# Patient Record
Sex: Female | Born: 1970
Health system: Southern US, Community
[De-identification: ages and names within clinical notes are randomized; demographics above are authoritative.]

## PROBLEM LIST (undated history)

## (undated) DIAGNOSIS — N943 Premenstrual tension syndrome: Secondary | ICD-10-CM

## (undated) DIAGNOSIS — M1990 Unspecified osteoarthritis, unspecified site: Secondary | ICD-10-CM

## (undated) DIAGNOSIS — T7840XA Allergy, unspecified, initial encounter: Secondary | ICD-10-CM

## (undated) DIAGNOSIS — J302 Other seasonal allergic rhinitis: Secondary | ICD-10-CM

## (undated) DIAGNOSIS — K219 Gastro-esophageal reflux disease without esophagitis: Secondary | ICD-10-CM

## (undated) HISTORY — DX: Other seasonal allergic rhinitis: J30.2

## (undated) HISTORY — DX: Gastro-esophageal reflux disease without esophagitis: K21.9

## (undated) HISTORY — DX: Allergy, unspecified, initial encounter: T78.40XA

## (undated) HISTORY — DX: Unspecified osteoarthritis, unspecified site: M19.90

## (undated) HISTORY — PX: WISDOM TOOTH EXTRACTION: SHX21

## (undated) HISTORY — DX: Premenstrual tension syndrome: N94.3

## (undated) HISTORY — PX: ABDOMINAL HYSTERECTOMY: SHX81

## (undated) HISTORY — PX: LEFT OOPHORECTOMY: SHX1961

---

## 2000-09-17 ENCOUNTER — Encounter: Admission: RE | Admit: 2000-09-17 | Discharge: 2000-09-17 | Payer: Self-pay | Admitting: Family Medicine

## 2000-09-17 ENCOUNTER — Encounter: Payer: Self-pay | Admitting: Family Medicine

## 2001-02-11 ENCOUNTER — Ambulatory Visit (HOSPITAL_COMMUNITY): Admission: RE | Admit: 2001-02-11 | Discharge: 2001-02-11 | Payer: Self-pay | Admitting: *Deleted

## 2001-07-06 ENCOUNTER — Other Ambulatory Visit: Admission: RE | Admit: 2001-07-06 | Discharge: 2001-07-06 | Payer: Self-pay | Admitting: Obstetrics and Gynecology

## 2002-01-26 ENCOUNTER — Inpatient Hospital Stay (HOSPITAL_COMMUNITY): Admission: AD | Admit: 2002-01-26 | Discharge: 2002-01-28 | Payer: Self-pay | Admitting: Obstetrics and Gynecology

## 2002-03-02 ENCOUNTER — Other Ambulatory Visit: Admission: RE | Admit: 2002-03-02 | Discharge: 2002-03-02 | Payer: Self-pay | Admitting: Obstetrics and Gynecology

## 2003-04-03 ENCOUNTER — Other Ambulatory Visit: Admission: RE | Admit: 2003-04-03 | Discharge: 2003-04-03 | Payer: Self-pay | Admitting: Obstetrics and Gynecology

## 2004-06-19 ENCOUNTER — Encounter: Admission: RE | Admit: 2004-06-19 | Discharge: 2004-06-19 | Payer: Self-pay | Admitting: Obstetrics and Gynecology

## 2004-06-27 ENCOUNTER — Inpatient Hospital Stay (HOSPITAL_COMMUNITY): Admission: RE | Admit: 2004-06-27 | Discharge: 2004-06-29 | Payer: Self-pay | Admitting: Obstetrics and Gynecology

## 2005-07-22 ENCOUNTER — Ambulatory Visit (HOSPITAL_COMMUNITY): Admission: RE | Admit: 2005-07-22 | Discharge: 2005-07-22 | Payer: Self-pay | Admitting: Obstetrics and Gynecology

## 2006-08-27 ENCOUNTER — Inpatient Hospital Stay (HOSPITAL_COMMUNITY): Admission: RE | Admit: 2006-08-27 | Discharge: 2006-08-30 | Payer: Self-pay | Admitting: Obstetrics and Gynecology

## 2010-03-01 ENCOUNTER — Other Ambulatory Visit: Payer: Self-pay | Admitting: Obstetrics and Gynecology

## 2010-03-01 DIAGNOSIS — Z1239 Encounter for other screening for malignant neoplasm of breast: Secondary | ICD-10-CM

## 2010-04-08 ENCOUNTER — Ambulatory Visit: Payer: Self-pay

## 2010-04-15 ENCOUNTER — Ambulatory Visit: Payer: Self-pay

## 2010-04-18 ENCOUNTER — Ambulatory Visit
Admission: RE | Admit: 2010-04-18 | Discharge: 2010-04-18 | Disposition: A | Discharge status: Home / Self Care | Payer: BC Managed Care – PPO | Source: Ambulatory Visit | Attending: Obstetrics and Gynecology | Admitting: Obstetrics and Gynecology

## 2010-04-18 DIAGNOSIS — Z1239 Encounter for other screening for malignant neoplasm of breast: Secondary | ICD-10-CM

## 2010-11-24 LAB — CBC
HCT: 26.8 — ABNORMAL LOW
HCT: 30.6 — ABNORMAL LOW
Hemoglobin: 8.9 — ABNORMAL LOW
Hemoglobin: 9.9 — ABNORMAL LOW
MCHC: 32.5
MCV: 81.6
MCV: 82.3
Platelets: 300
RBC: 3.29 — ABNORMAL LOW
RBC: 3.71 — ABNORMAL LOW
RDW: 14.4 — ABNORMAL HIGH
WBC: 12.5 — ABNORMAL HIGH
WBC: 6.8

## 2010-11-24 LAB — RPR: RPR Ser Ql: NONREACTIVE

## 2011-05-07 ENCOUNTER — Ambulatory Visit (INDEPENDENT_AMBULATORY_CARE_PROVIDER_SITE_OTHER): Payer: BC Managed Care – PPO | Admitting: Internal Medicine

## 2011-05-07 ENCOUNTER — Encounter: Payer: Self-pay | Admitting: Internal Medicine

## 2011-05-07 VITALS — BP 146/78 | HR 96 | Temp 98.2°F | Ht 67.0 in | Wt 172.1 lb

## 2011-05-07 DIAGNOSIS — Z801 Family history of malignant neoplasm of trachea, bronchus and lung: Secondary | ICD-10-CM | POA: Insufficient documentation

## 2011-05-07 DIAGNOSIS — J309 Allergic rhinitis, unspecified: Secondary | ICD-10-CM

## 2011-05-07 DIAGNOSIS — N943 Premenstrual tension syndrome: Secondary | ICD-10-CM

## 2011-05-07 DIAGNOSIS — N951 Menopausal and female climacteric states: Secondary | ICD-10-CM

## 2011-05-07 DIAGNOSIS — K219 Gastro-esophageal reflux disease without esophagitis: Secondary | ICD-10-CM

## 2011-05-07 DIAGNOSIS — J387 Other diseases of larynx: Secondary | ICD-10-CM

## 2011-05-07 DIAGNOSIS — Z780 Asymptomatic menopausal state: Secondary | ICD-10-CM

## 2011-05-07 DIAGNOSIS — T7840XA Allergy, unspecified, initial encounter: Secondary | ICD-10-CM

## 2011-05-07 NOTE — Patient Instructions (Signed)
schedul cpe with me  To have fasting labs done

## 2011-05-07 NOTE — Progress Notes (Signed)
Subjective:    Patient ID: Melissa Evans, female    DOB: May 04, 1970, 41 y.o.   MRN: [id number]  HPI  New pt. Here for first visit.  Former care Dr. Lucas Evans. Melissa Evans.  PMH of allergic rhinitis,  GERD, and mood instability around menses.  She reports she has "bad PMS"  Symptoms of moodiness "feeling like I'm in a cloud"  Prior to menses and then improved once menses start.    She has never seen a psychiatrist for her PMS symptoms but would like to.  Significant stress as spouse had depression and last summer required hospitalization at [facility].  He is now seeing Psychiatrist Dr. Alois Evans in [facility] who is affiliated with [facility].  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    She is very interested in speaking with someone regarding latest in diagnostic testing for lung cancer.    Both mother and maternal GF have lung CA and non-smokers  No Known Allergies Past Medical History  Diagnosis Date  . Allergy   . LPRD (laryngopharyngeal reflux disease)   . PMS (premenstrual syndrome)    Past Surgical History  Procedure Date  . Left oophorectomy   . Wisdom tooth extraction    History   Social History  . Marital Status: Married    Spouse Name: N/A    Number of Children: N/A  . Years of Education: N/A   Occupational History  . Not on file.   Social History Main Topics  . Smoking status: Never Smoker   . Smokeless tobacco: Never Used  . Alcohol Use: Yes  . Drug Use: No  . Sexually Active: Yes   Other Topics Concern  . Not on file   Social History Narrative  . No narrative on file   Family History  Problem Relation Age of Onset  . Lung cancer Mother   . Fibroids Sister   . Parkinsonism Maternal Grandmother   . Diabetes Maternal Grandmother   . Lung cancer Maternal Grandfather    Patient Active Problem List  Diagnoses  . Allergy  . LPRD (laryngopharyngeal reflux disease)  . PMS (premenstrual syndrome)  . Family history of lung cancer   Current Outpatient  Prescriptions on File Prior to Visit  Medication Sig Dispense Refill  . cetirizine (ZYRTEC) 10 MG tablet Take 10 mg by mouth daily as needed.           Review of Systems    see HPI Objective:   Physical Exam Physical Exam  Nursing note and vitals reviewed.   Teary when speaking about husband's illness Constitutional: She is oriented to person, place, and time. She appears well-developed and well-nourished.  HENT:  Head: Normocephalic and atraumatic.  Cardiovascular: Normal rate and regular rhythm. Exam reveals no gallop and no friction rub.  No murmur heard.  Pulmonary/Chest: Breath sounds normal. She has no wheezes. She has no rales.  Neurological: She is alert and oriented to person, place, and time.  Skin: Skin is warm and dry.  Psychiatric: She has a normal mood and affect. Her behavior is normal.         Assessment & Plan:  1)  Probable PMS/ with lots of situational stress  Will refer to Dr.  Lamont Evans 2)  Allergic rhinitis 3)  Strong FH of LUng CA  I gave number to nurse navigator  Melissa Evans at [facility] for pt to speak to.  She is certainly a candidate for spiral CT at age 46.  She is  aware that it is not covered by insurance.    Schedule CPE with me

## 2011-05-11 LAB — COMPREHENSIVE METABOLIC PANEL
AST: 16 U/L (ref 0–37)
Albumin: 4.3 g/dL (ref 3.5–5.2)
BUN: 15 mg/dL (ref 6–23)
Calcium: 9.4 mg/dL (ref 8.4–10.5)
Chloride: 107 mEq/L (ref 96–112)
Creat: 0.72 mg/dL (ref 0.50–1.10)
Glucose, Bld: 82 mg/dL (ref 70–99)
Potassium: 4.4 mEq/L (ref 3.5–5.3)

## 2011-05-11 LAB — CBC WITH DIFFERENTIAL/PLATELET
Basophils Absolute: 0.1 10*3/uL (ref 0.0–0.1)
Basophils Relative: 1 % (ref 0–1)
Eosinophils Relative: 4 % (ref 0–5)
HCT: 38.3 % (ref 36.0–46.0)
Hemoglobin: 11.6 g/dL — ABNORMAL LOW (ref 12.0–15.0)
Lymphocytes Relative: 37 % (ref 12–46)
MCHC: 30.3 g/dL (ref 30.0–36.0)
MCV: 85.3 fL (ref 78.0–100.0)
Monocytes Absolute: 0.6 10*3/uL (ref 0.1–1.0)
Monocytes Relative: 9 % (ref 3–12)
Neutro Abs: 3.5 10*3/uL (ref 1.7–7.7)
RDW: 14.8 % (ref 11.5–15.5)

## 2011-05-11 LAB — LIPID PANEL
HDL: 59 mg/dL (ref >39)
Total CHOL/HDL Ratio: 2.4 Ratio
Triglycerides: 38 mg/dL (ref <150)

## 2011-05-12 LAB — VITAMIN D 25 HYDROXY (VIT D DEFICIENCY, FRACTURES): Vit D, 25-Hydroxy: 19 ng/mL — ABNORMAL LOW (ref 30–89)

## 2011-05-13 ENCOUNTER — Telehealth: Payer: Self-pay | Admitting: Emergency Medicine

## 2011-05-13 NOTE — Telephone Encounter (Signed)
Labs from 05/11/11 mailed to pt's home address per DDS

## 2011-05-14 ENCOUNTER — Telehealth: Payer: Self-pay | Admitting: Internal Medicine

## 2011-05-14 NOTE — Telephone Encounter (Signed)
Pt called giving her reading of blood pressure 119/74 pulse 75 on 05/09/2011.   She would like to know if she have to do lab work on the day of her physical..AD

## 2011-05-14 NOTE — Telephone Encounter (Signed)
Aware labs done were pre-physical labs, will not have to have any additional labs repeated at the time of her physical

## 2011-05-30 ENCOUNTER — Encounter (HOSPITAL_COMMUNITY): Payer: Self-pay | Admitting: *Deleted

## 2011-05-30 ENCOUNTER — Emergency Department (HOSPITAL_COMMUNITY)
Admission: EM | Admit: 2011-05-30 | Discharge: 2011-05-31 | Disposition: A | Discharge status: Home / Self Care | Payer: BC Managed Care – PPO | Attending: Emergency Medicine | Admitting: Emergency Medicine

## 2011-05-30 DIAGNOSIS — N949 Unspecified condition associated with female genital organs and menstrual cycle: Secondary | ICD-10-CM | POA: Insufficient documentation

## 2011-05-30 DIAGNOSIS — R1900 Intra-abdominal and pelvic swelling, mass and lump, unspecified site: Secondary | ICD-10-CM | POA: Insufficient documentation

## 2011-05-30 DIAGNOSIS — R112 Nausea with vomiting, unspecified: Secondary | ICD-10-CM | POA: Insufficient documentation

## 2011-05-30 DIAGNOSIS — R102 Pelvic and perineal pain: Secondary | ICD-10-CM

## 2011-05-30 DIAGNOSIS — R1031 Right lower quadrant pain: Secondary | ICD-10-CM | POA: Insufficient documentation

## 2011-05-30 LAB — CBC
HCT: 37.5 % (ref 36.0–46.0)
Hemoglobin: 11.7 g/dL — ABNORMAL LOW (ref 12.0–15.0)
MCH: 26 pg (ref 26.0–34.0)
MCHC: 31.2 g/dL (ref 30.0–36.0)
MCV: 83.3 fL (ref 78.0–100.0)
RBC: 4.5 MIL/uL (ref 3.87–5.11)

## 2011-05-30 LAB — DIFFERENTIAL
Basophils Relative: 1 % (ref 0–1)
Eosinophils Absolute: 0.1 10*3/uL (ref 0.0–0.7)
Eosinophils Relative: 2 % (ref 0–5)
Lymphs Abs: 1.8 10*3/uL (ref 0.7–4.0)
Monocytes Absolute: 0.4 10*3/uL (ref 0.1–1.0)
Monocytes Relative: 6 % (ref 3–12)

## 2011-05-30 LAB — POCT I-STAT, CHEM 8
BUN: 15 mg/dL (ref 6–23)
Calcium, Ion: 1.19 mmol/L (ref 1.12–1.32)
Creatinine, Ser: 0.6 mg/dL (ref 0.50–1.10)
Glucose, Bld: 99 mg/dL (ref 70–99)
Hemoglobin: 13.6 g/dL (ref 12.0–15.0)
Sodium: 139 mEq/L (ref 135–145)
TCO2: 25 mmol/L (ref 0–100)

## 2011-05-30 LAB — WET PREP, GENITAL: Trich, Wet Prep: NONE SEEN

## 2011-05-30 MED ORDER — ONDANSETRON HCL 4 MG/2ML IJ SOLN
4.0000 mg | Freq: Once | INTRAMUSCULAR | Status: AC
  Administered 2011-05-30: 4 mg via INTRAVENOUS
  Filled 2011-05-30: qty 2

## 2011-05-30 MED ORDER — MORPHINE SULFATE 4 MG/ML IJ SOLN
4.0000 mg | Freq: Once | INTRAMUSCULAR | Status: AC
  Administered 2011-05-30: 4 mg via INTRAVENOUS
  Filled 2011-05-30: qty 1

## 2011-05-30 NOTE — ED Provider Notes (Signed)
History     CSN: [id number]  Arrival date & time 05/30/11  4098   First MD Initiated Contact with Patient 05/30/11 2135      Chief Complaint  Patient presents with  . Abdominal Pain    hx of ovarian cysts    (Consider location/radiation/quality/duration/timing/severity/associated sxs/prior treatment) Patient is a 41 y.o. female presenting with abdominal pain. The history is provided by the patient.  Abdominal Pain The primary symptoms of the illness include abdominal pain, nausea and vomiting. The primary symptoms of the illness do not include fever, dysuria or vaginal discharge. The onset of the illness was gradual. The problem has been gradually worsening.  The abdominal pain radiates to the RLQ.  Associated symptoms comments: RLQ pain that started this morning and has gotten progressively worse throughout the day. Nausea with vomiting with worst pain. No fever. No change in bowel habits. She reports being mid-cycle and having a remote history of ovarian cyst. She denies vaginal discharge or bleeding..    Past Medical History  Diagnosis Date  . Allergy   . LPRD (laryngopharyngeal reflux disease)   . PMS (premenstrual syndrome)     Past Surgical History  Procedure Date  . Left oophorectomy   . Wisdom tooth extraction     Family History  Problem Relation Age of Onset  . Lung cancer Mother   . Fibroids Sister   . Parkinsonism Maternal Grandmother   . Diabetes Maternal Grandmother   . Lung cancer Maternal Grandfather     History  Substance Use Topics  . Smoking status: Never Smoker   . Smokeless tobacco: Never Used  . Alcohol Use: Yes    OB History    Grav Para Term Preterm Abortions TAB SAB Ect Mult Living   3 2   1  1          Review of Systems  Constitutional: Negative for fever.  Gastrointestinal: Positive for nausea, vomiting and abdominal pain.  Genitourinary: Negative for dysuria, vaginal discharge and vaginal pain.    Allergies  Review of  patient's allergies indicates no known allergies.  Home Medications   Current Outpatient Rx  Name Route Sig Dispense Refill  . ACETAMINOPHEN 500 MG PO TABS Oral Take 1,000 mg by mouth every 6 (six) hours as needed. pain    . CETIRIZINE HCL 10 MG PO TABS Oral Take 10 mg by mouth daily as needed. allergies      BP 129/81  Pulse 68  Temp(Src) 98.6 F (37 C) (Oral)  Resp 20  SpO2 100%  LMP 05/17/2011  Physical Exam  Constitutional: She appears well-developed and well-nourished. No distress.  Abdominal: Soft. There is tenderness. There is no rebound and no guarding.    Genitourinary: Vagina normal.       Cervix normal in appearance, no discharge. Uterine fibroids palpated bilaterally, non-tender. Right adnexal tenderness.    ED Course  Procedures (including critical care time)  Labs Reviewed  CBC - Abnormal; Notable for the following:    Hemoglobin 11.7 (*)    All other components within normal limits  DIFFERENTIAL  POCT PREGNANCY, URINE  POCT I-STAT, CHEM 8  WET PREP, GENITAL  GC/CHLAMYDIA PROBE AMP, GENITAL   No results found. Results for orders placed during the hospital encounter of 05/30/11  CBC      Component Value Range   WBC 7.4  4.0 - 10.5 (K/uL)   RBC 4.50  3.87 - 5.11 (MIL/uL)   Hemoglobin 11.7 (*) 12.0 - 15.0 (g/dL)  HCT 37.5  36.0 - 46.0 (%)   MCV 83.3  78.0 - 100.0 (fL)   MCH 26.0  26.0 - 34.0 (pg)   MCHC 31.2  30.0 - 36.0 (g/dL)   RDW 16.1  09.6 - 04.5 (%)   Platelets 326  150 - 400 (K/uL)  DIFFERENTIAL      Component Value Range   Neutrophils Relative 68  43 - 77 (%)   Neutro Abs 5.1  1.7 - 7.7 (K/uL)   Lymphocytes Relative 24  12 - 46 (%)   Lymphs Abs 1.8  0.7 - 4.0 (K/uL)   Monocytes Relative 6  3 - 12 (%)   Monocytes Absolute 0.4  0.1 - 1.0 (K/uL)   Eosinophils Relative 2  0 - 5 (%)   Eosinophils Absolute 0.1  0.0 - 0.7 (K/uL)   Basophils Relative 1  0 - 1 (%)   Basophils Absolute 0.1  0.0 - 0.1 (K/uL)  POCT PREGNANCY, URINE       Component Value Range   Preg Test, Ur NEGATIVE  NEGATIVE   POCT I-STAT, CHEM 8      Component Value Range   Sodium 139  135 - 145 (mEq/L)   Potassium 4.5  3.5 - 5.1 (mEq/L)   Chloride 104  96 - 112 (mEq/L)   BUN 15  6 - 23 (mg/dL)   Creatinine, Ser 4.09  0.50 - 1.10 (mg/dL)   Glucose, Bld 99  70 - 99 (mg/dL)   Calcium, Ion 8.11  9.14 - 1.32 (mmol/L)   TCO2 25  0 - 100 (mmol/L)   Hemoglobin 13.6  12.0 - 15.0 (g/dL)   HCT 78.2  95.6 - 21.3 (%)  WET PREP, GENITAL      Component Value Range   Yeast Wet Prep HPF POC NONE SEEN  NONE SEEN    Trich, Wet Prep NONE SEEN  NONE SEEN    Clue Cells Wet Prep HPF POC NONE SEEN  NONE SEEN    WBC, Wet Prep HPF POC RARE (*) NONE SEEN   URINALYSIS, ROUTINE W REFLEX MICROSCOPIC      Component Value Range   Color, Urine AMBER (*) YELLOW    APPearance CLEAR  CLEAR    Specific Gravity, Urine 1.031 (*) 1.005 - 1.030    pH 8.0  5.0 - 8.0    Glucose, UA NEGATIVE  NEGATIVE (mg/dL)   Hgb urine dipstick NEGATIVE  NEGATIVE    Bilirubin Urine NEGATIVE  NEGATIVE    Ketones, ur 15 (*) NEGATIVE (mg/dL)   Protein, ur 30 (*) NEGATIVE (mg/dL)   Urobilinogen, UA 1.0  0.0 - 1.0 (mg/dL)   Nitrite NEGATIVE  NEGATIVE    Leukocytes, UA NEGATIVE  NEGATIVE   URINE MICROSCOPIC-ADD ON      Component Value Range   Squamous Epithelial / LPF RARE  RARE    WBC, UA 0-2  <3 (WBC/hpf)   RBC / HPF 0-2  <3 (RBC/hpf)   Bacteria, UA RARE  RARE    Crystals TRIPLE PHOSPHATE CRYSTALS (*) NEGATIVE    Urine-Other MUCOUS PRESENT     US Transvaginal Non-ob  05/31/2011  *RADIOLOGY REPORT*  Clinical Data: Abdominal pain.  History of ovarian cysts.  Prior oophorectomy. History of right dermoid.  TRANSABDOMINAL AND TRANSVAGINAL ULTRASOUND OF PELVIS Technique:  Both transabdominal and transvaginal ultrasound examinations of the pelvis were performed. Transabdominal technique was performed for global imaging of the pelvis including uterus, ovaries, adnexal regions, and pelvic cul-de-sac.   Comparison: CT 06/19/2004  It was necessary to proceed with endovaginal exam following the transabdominal exam to visualize the the uterus and cystic mass.  Findings:  Uterus: The uterus measures 9.7 x 5.4 x 6.5 cm.  Normal homogeneous myometrial echotexture.  No focal mass lesions.  Endometrium: Normal endometrial stripe thickness, measured at 3 mm.  Right ovary:  The right ovary is not specifically identified.  Left ovary: Surgically absent.  Other findings: There is a large complex partially septated mass superior to the uterus and measuring about 7.8 x 8.3 x 13.7 cm. There are nodular septations with central heterogeneous nodular focus containing blood flow on color flow Doppler imaging.  Some of the cystic compartments contained complex fluid.  The appearance would be consistent with but not specific for a dermoid cyst, as suggested in the history. Cystic neoplasm is not excluded. A similar lesion was demonstrated on the previous CT scan, although today's structure measures larger.  No free pelvic fluid collections.  IMPRESSION: Normal appearance of the uterus and endometrium.  Surgical absence of the left ovary.  Right ovary not specifically visualized.  Large complex cystic mass with nodular septations superior to the uterus. A similar structure was demonstrated on the previous CT scan.  The appearance is compatible with the history of dermoid cyst although this is not specific for a dermoid and cystic neoplasm is not excluded.  Original Report Authenticated By: Marlon Pel, M.D.   US Pelvis Complete  05/31/2011  *RADIOLOGY REPORT*  Clinical Data: Abdominal pain.  History of ovarian cysts.  Prior oophorectomy. History of right dermoid.  TRANSABDOMINAL AND TRANSVAGINAL ULTRASOUND OF PELVIS Technique:  Both transabdominal and transvaginal ultrasound examinations of the pelvis were performed. Transabdominal technique was performed for global imaging of the pelvis including uterus, ovaries, adnexal  regions, and pelvic cul-de-sac.  Comparison: CT 06/19/2004   It was necessary to proceed with endovaginal exam following the transabdominal exam to visualize the the uterus and cystic mass.  Findings:  Uterus: The uterus measures 9.7 x 5.4 x 6.5 cm.  Normal homogeneous myometrial echotexture.  No focal mass lesions.  Endometrium: Normal endometrial stripe thickness, measured at 3 mm.  Right ovary:  The right ovary is not specifically identified.  Left ovary: Surgically absent.  Other findings: There is a large complex partially septated mass superior to the uterus and measuring about 7.8 x 8.3 x 13.7 cm. There are nodular septations with central heterogeneous nodular focus containing blood flow on color flow Doppler imaging.  Some of the cystic compartments contained complex fluid.  The appearance would be consistent with but not specific for a dermoid cyst, as suggested in the history. Cystic neoplasm is not excluded. A similar lesion was demonstrated on the previous CT scan, although today's structure measures larger.  No free pelvic fluid collections.  IMPRESSION: Normal appearance of the uterus and endometrium.  Surgical absence of the left ovary.  Right ovary not specifically visualized.  Large complex cystic mass with nodular septations superior to the uterus. A similar structure was demonstrated on the previous CT scan.  The appearance is compatible with the history of dermoid cyst although this is not specific for a dermoid and cystic neoplasm is not excluded.  Original Report Authenticated By: Marlon Pel, M.D.   Ct Abdomen Pelvis W Contrast  05/31/2011  *RADIOLOGY REPORT*  Clinical Data: Right pelvic pain and vomiting.  History of ovarian cysts.  CT ABDOMEN AND PELVIS WITH CONTRAST  Technique:  Multidetector CT imaging of the abdomen and pelvis was  performed following the standard protocol during bolus administration of intravenous contrast.  Contrast: OMNIPAQUE IOHEXOL 300 MG/ML  SOLN   Comparison: 06/19/2004  Findings: Mild dependent atelectasis in the lung bases.  Sub centimeter cysts in the liver.  The gallbladder, bile ducts, spleen, pancreas, adrenal glands, kidneys, abdominal aorta, and retroperitoneal lymph nodes are unremarkable.  The stomach and small bowel are decompressed.  No free air or free fluid in the abdomen.  Stool filled colon without wall thickening or distension.  Pelvis:  The appendix is normal.  The there is a large complex cystic lesion in the right anterior pelvis with central hyperdense nodularity.  The lesion is measured at about 8.1 x 9.4 by 14.4 cm. This is enlarged since the previous study.  The lesion is best characterized on ultrasound.  See additional report from today's ultrasound.  The bladder is decompressed.  The uterus is not enlarged.  Small amount of free fluid in the pelvis.  No significant pelvic lymphadenopathy.  Normal alignment of the lumbar spine.  IMPRESSION: Large complex cystic lesion in the right anterior pelvis, demonstrating increased size since previous study.  Cystic neoplasm is not excluded.  No acute inflammatory changes demonstrated in the abdomen or pelvis.  Original Report Authenticated By: Marlon Pel, M.D.    No diagnosis found. 1. Pelvic mass 2. Pelvic pain    MDM  Pain has been difficult to manage in ED, requiring multiple doses IV pain meds. Ultrasound negative - felt CT warranted secondary to pain. Right pelvic mass, increased in size from previous study, neoplasm not excluded. Patient with a history of fibroids as well as left ovary removal secondary to cyst. Discussed results with patient and strongly encouraged close follow up this week.         Rodena Medin, PA-C 05/31/11 0510

## 2011-05-30 NOTE — ED Notes (Signed)
Pt c/o R pelvic pain sudden onset earlier today. Pt states pain has become so bad that she vomited x 4 today. Pt has hx of ovarian cysts and extreme pain w/ ovulation.

## 2011-05-31 ENCOUNTER — Emergency Department (HOSPITAL_COMMUNITY): Payer: BC Managed Care – PPO

## 2011-05-31 LAB — URINALYSIS, ROUTINE W REFLEX MICROSCOPIC
Bilirubin Urine: NEGATIVE
Ketones, ur: 15 mg/dL — AB
Leukocytes, UA: NEGATIVE
Nitrite: NEGATIVE
Specific Gravity, Urine: 1.031 — ABNORMAL HIGH (ref 1.005–1.030)
Urobilinogen, UA: 1 mg/dL (ref 0.0–1.0)

## 2011-05-31 LAB — URINE MICROSCOPIC-ADD ON

## 2011-05-31 MED ORDER — HYDROMORPHONE HCL PF 1 MG/ML IJ SOLN
0.5000 mg | Freq: Once | INTRAMUSCULAR | Status: AC
  Administered 2011-05-31: 0.5 mg via INTRAVENOUS
  Filled 2011-05-31: qty 1

## 2011-05-31 MED ORDER — IOHEXOL 300 MG/ML  SOLN
100.0000 mL | Freq: Once | INTRAMUSCULAR | Status: AC | PRN
  Administered 2011-05-31: 100 mL via INTRAVENOUS

## 2011-05-31 MED ORDER — ONDANSETRON HCL 4 MG/2ML IJ SOLN
INTRAMUSCULAR | Status: AC
  Administered 2011-05-31: 4 mg
  Filled 2011-05-31: qty 2

## 2011-05-31 MED ORDER — HYDROMORPHONE HCL PF 1 MG/ML IJ SOLN
INTRAMUSCULAR | Status: AC
  Administered 2011-05-31: 1 mg via INTRAVENOUS
  Filled 2011-05-31: qty 1

## 2011-05-31 MED ORDER — ONDANSETRON HCL 4 MG/2ML IJ SOLN
4.0000 mg | Freq: Once | INTRAMUSCULAR | Status: AC
  Administered 2011-05-31: 4 mg via INTRAVENOUS
  Filled 2011-05-31: qty 2

## 2011-05-31 MED ORDER — KETOROLAC TROMETHAMINE 30 MG/ML IJ SOLN
30.0000 mg | Freq: Once | INTRAMUSCULAR | Status: AC
  Administered 2011-05-31: 30 mg via INTRAVENOUS
  Filled 2011-05-31: qty 1

## 2011-05-31 MED ORDER — PROMETHAZINE HCL 25 MG/ML IJ SOLN
12.5000 mg | Freq: Once | INTRAMUSCULAR | Status: AC
  Administered 2011-05-31: 12.5 mg via INTRAVENOUS
  Filled 2011-05-31: qty 1

## 2011-05-31 MED ORDER — OXYCODONE-ACETAMINOPHEN 5-325 MG PO TABS: 1.0000 | ORAL_TABLET | ORAL | Status: AC | PRN

## 2011-05-31 NOTE — ED Provider Notes (Signed)
Medical screening examination/treatment/procedure(s) were conducted as a shared visit with non-physician practitioner(s) and myself.  I personally evaluated the patient during the encounter  5:05 AM Patient's pain improved with IV medications. There is some remaining right suprapubic tenderness on exam. Abdomen is soft. Patient was advised of the CT and ultrasound findings. While most likely this represents fibroid she was advised that a malignancy cannot be excluded. She will followup with Dr. Billy Coast, her gynecologist.  Hanley Seamen, MD 05/31/11 [phone]

## 2011-05-31 NOTE — ED Notes (Signed)
Patient discharge via ambulatory with a steady gait. Respirations equal and unlabored. Skin warm and dry. No acute distress noted. 

## 2011-05-31 NOTE — Discharge Instructions (Signed)
FOLLOW UP WITH DR. Billy Coast THIS WEEK FOR FURTHER EVALUATION AND DIAGNOSIS OF PELVIC MASS. TAKE PERCOCET FOR PAIN AS DIRECTED. IF YOU HAVE UNCONTROLLED OR SEVERE PAIN, OR IF YOU DEVELOP A FEVER OR NEW CONCERNING SYMPTOM, GO TO [facility] FOR FURTHER EMERGENT EVALUATION. RETURN HERE AS NEEDED.  Pelvic Mass A "mass" is a lump that either your caregiver found during an examination or you found before seeing your caregiver. The "pelvis" is the lower portion of the trunk in between the hip bones. There are many possible reasons why a lump has appeared. Testing will help determine the cause and the steps to a solution. CAUSES  Before complete testing is done, it may be difficult or impossible for your caregiver to know if the lump is truly in one of the pelvic organs (such as the uterus or ovaries) or is coming from one of many organs that are near the pelvis. Problems in the colon or kidney can also lead to a lump that might seem to be in the pelvis. If testing shows that the mass is in the pelvis, there are still many possible causes:  Tumors and cancers. These problems are relatively common and are the greatest source of worry for patients. Cancerous lumps in the pelvis may be due to cancers that started in the uterus or ovaries or due to cancers that started in other areas and then spread to the pelvis. Many cancers are very treatable when found early.   Non-cancerous tumors and masses. There are a large number of common and uncommon non-cancerous problems that can lead to a mass in the pelvis. Two very common ones are fibroids of the uterus and ovarian cysts. Before testing and/or surgery, it may be impossible to tell the difference between these problems and a cancer.   Infection. Certain types of infections can produce a mass in the pelvis. The infection might be caused by bacteria. If there is an infection treatment might include antibiotics. Masses from infection can also be caused by certain  viruses, and in rare cases, by fungi or parasites. If infection is the cause, your caregiver will be able to determine the type of germ responsible for the mass by doing appropriate testing.   Inflammatory bowel disease. These are diseases thought to be caused by a defect in the immune system of the intestine. There are two inflammatory bowel diseases: Ulcerative Colitis and Crohn's Disease. They are lifelong problems with symptoms that can come and go. Sometimes, patients with these diseases will develop a mass in the lower part of the colon that can make it seem as though there is a mass in the pelvis.   Past Surgery. If there has been pelvic surgery in the past, and there is a lot of scarring that forms during the process of healing, this can eventually fell like a mass when examined by your caregiver. As with the other problems described above, this may or may not be associated with symptoms or feeling badly.   Ectopic Pregnancy. This is a condition where the growing fetus is growing outside of the uterus. This is a common cause for a pelvic mass and may become a serious or life-threatening problem that requires immediate surgery.  SYMPTOMS  In people with a pelvic mass, there may be a large variety of associated symptoms including:   No symptoms, other than the appearance of the mass itself.   Cramping, nausea, diarrhea.   Fever, vomiting, weakness.   Pelvic, side, and/or back pain.  Weight loss.   Constipation.   Problems with vaginal bleeding. This can be very variable. Bleeding might be very light or very heavy. Bleeding may be mixed with large clots. Menstruation may be very frequent and may seem to almost completely stop. There may be varying levels of pain with menstruation.   Urinary symptoms including frequent urination, inability to empty the bladder completely, or urinating very small amounts.  DIAGNOSIS  Because of the large number of causes of a mass in the pelvis, your  caregiver will ask you to undergo testing in order to get a clear diagnosis in a timely manner. The tests might include some or all of the following:  Blood tests such as a blood count, measurement of common minerals in the blood, kidney/liver/pancreas function, pregnancy test, and others.   X-rays. Plain x-rays and special x-rays may be requested except if you are pregnant.   Ultrasound. This is a test that uses sound waves to "paint a picture" of the mass. The type of "sound picture" that is seen can help to narrow the diagnosis.   CAT scan and MRI imaging. Each can provide additional information as to the different characteristics of the mass and can help to develop a final plan for diagnostics and treatment. If cancer is suspected, these special tests can also help to show any spread of the cancer to other parts of the body. It is possible that these tests may not be ordered if you are pregnant.   Laparoscopy. This is a special exam of the inside of the pelvic area using a slim, flexible, lighted tube. This allows your caregiver to get a direct look at the mass. Sometimes, this allows getting a very small piece of the mass (a biopsy). This piece of tissue can then be examined in a lab that will frequently lead to a clear diagnosis. In some cases, your caregiver can use a laparoscope to completely remove the mass after it has been examined.   Surgery. Sometimes, a diagnosis can only be made by carrying out an operation and obtaining a biopsy (as noted above). Many times, the biopsy is obtained and the mass is removed during the same operation.  These are the most common ways for determining the exact cause of the mass. Your caregiver may recommend other tests that are not listed here. TREATMENT  Treatment(s) can only be recommended after a diagnosis is made. Your caregiver will discuss your test results with you, the meanings of the tests, and the recommended steps to begin treatment. He/she will  also recommend whether you need to be examined by specialists as you go through the steps of diagnosis and a treatment plan is developed. HOME CARE INSTRUCTIONS   Test preparation. Carefully follow instructions when preparing for certain tests. This may involve many things such as:   Drinking fluids to fill the bladder before a pelvic ultrasound.   Fasting before certain blood tests.   Drinking special "contrast" fluids that are necessary for obtaining the best CAT scan and MRI images.   Medications. Your caregiver may prescribe medications to help relieve symptoms while you undergo testing. It is important that your current medications (prescription, non-prescription, herbal, vitamins, etc.) be kept in mind when new prescriptions are recommended.   Diet. There may be a need for changes in diet in order to help with symptom relief while testing is being done. If this applies to you, your caregiver will discuss these changes with you.  SEEK MEDICAL CARE IF:  You cannot hold down any of the recommended fluids used to prepare for tests such as CAT scan MRI.   You feel that you are having trouble with any new prescriptions.   You develop new symptoms of pain, vomiting, diarrhea, fever, or other problems that you did not feel since your last exam.   You experience inability to empty your bladder completely or develop painful and/or bloody urination.  SEEK IMMEDIATE MEDICAL CARE IF:   You vomit bright red blood, or a coffee ground appearing material.   You have blood in your stools, or the stools turn black and tarry.   You have an abnormal or increased amount of vaginal bleeding.   You have a fever.   You develop easy bruising or bleeding.   You develop pain that is not controlled by your medication.   You feel worsening weakness or you have a fainting episode.   You feel that the mass has suddenly gotten larger.   You develop severe bloating in the abdomen and/or pelvis.   You  cannot pass any urine.  MAKE SURE YOU:   Understand these instructions.   Will watch your condition.   Will get help right away if you are not doing well or get worse.  Document Released: 05/05/2006 Document Revised: 01/15/2011 Document Reviewed: 01/11/2007 [facility] Patient Information 2012 [place], [place].

## 2011-06-01 ENCOUNTER — Telehealth: Payer: Self-pay | Admitting: Internal Medicine

## 2011-06-01 LAB — GC/CHLAMYDIA PROBE AMP, GENITAL
Chlamydia, DNA Probe: NEGATIVE
GC Probe Amp, Genital: NEGATIVE

## 2011-06-01 LAB — URINE CULTURE

## 2011-06-01 NOTE — ED Provider Notes (Signed)
Medical screening examination/treatment/procedure(s) were performed by non-physician practitioner and as supervising physician I was immediately available for consultation/collaboration.   Laray Anger, DO 06/01/11 0136

## 2011-06-01 NOTE — Telephone Encounter (Signed)
Pt wants to make you aware that she was in the ER on Sat 05/30/2011. They found a cyst on her ovaries and are talking about surgery; she want to inform you. She would like if you could touch base with her at [phone]. Once you look over the notes and reports from this past weekend. Thanks

## 2011-06-03 NOTE — Telephone Encounter (Signed)
Spoke with pt.  She has appt with Gyn/onc Friday in [facility]  Some constipation  Ok for miralax and colace daily

## 2011-07-02 ENCOUNTER — Ambulatory Visit (INDEPENDENT_AMBULATORY_CARE_PROVIDER_SITE_OTHER): Payer: BC Managed Care – PPO | Admitting: Internal Medicine

## 2011-07-02 ENCOUNTER — Encounter: Payer: Self-pay | Admitting: Internal Medicine

## 2011-07-02 ENCOUNTER — Ambulatory Visit (HOSPITAL_BASED_OUTPATIENT_CLINIC_OR_DEPARTMENT_OTHER)
Admission: RE | Admit: 2011-07-02 | Discharge: 2011-07-02 | Disposition: A | Discharge status: Home / Self Care | Payer: BC Managed Care – PPO | Source: Ambulatory Visit | Attending: Internal Medicine | Admitting: Internal Medicine

## 2011-07-02 VITALS — BP 118/74 | HR 69 | Temp 97.1°F | Resp 16 | Ht 67.0 in | Wt 178.0 lb

## 2011-07-02 DIAGNOSIS — Z0000 Encounter for general adult medical examination without abnormal findings: Secondary | ICD-10-CM

## 2011-07-02 DIAGNOSIS — D649 Anemia, unspecified: Secondary | ICD-10-CM

## 2011-07-02 DIAGNOSIS — R059 Cough, unspecified: Secondary | ICD-10-CM

## 2011-07-02 DIAGNOSIS — R1900 Intra-abdominal and pelvic swelling, mass and lump, unspecified site: Secondary | ICD-10-CM

## 2011-07-02 DIAGNOSIS — R05 Cough: Secondary | ICD-10-CM

## 2011-07-02 DIAGNOSIS — Z801 Family history of malignant neoplasm of trachea, bronchus and lung: Secondary | ICD-10-CM | POA: Insufficient documentation

## 2011-07-02 DIAGNOSIS — J309 Allergic rhinitis, unspecified: Secondary | ICD-10-CM

## 2011-07-02 LAB — POCT URINALYSIS DIPSTICK
Bilirubin, UA: NEGATIVE
Glucose, UA: NEGATIVE
Ketones, UA: NEGATIVE
Spec Grav, UA: 1.025
Urobilinogen, UA: 0.2

## 2011-07-02 NOTE — Progress Notes (Signed)
Subjective:    Patient ID: Melissa Evans, female    DOB: August 31, 1970, 41 y.o.   MRN: [id number]  HPI [name] is here for CPE. She has upcoming surgery in [facility] with Dr. Meredith Leeds and Dr. Robina Ade for her 14 cm pelvic mass.  She was set up by Dr. Billy Coast.    She reprorts her husband is feeling better and is seeing a psychiatrist at [facility]  She has appt in 3 weeks for her mammogram and has not seen Dr. Fidela Juneau as yet.    No Known Allergies Past Medical History  Diagnosis Date  . Allergy   . LPRD (laryngopharyngeal reflux disease)   . PMS (premenstrual syndrome)    Past Surgical History  Procedure Date  . Left oophorectomy   . Wisdom tooth extraction    History   Social History  . Marital Status: Married    Spouse Name: N/A    Number of Children: N/A  . Years of Education: N/A   Occupational History  . Not on file.   Social History Main Topics  . Smoking status: Never Smoker   . Smokeless tobacco: Never Used  . Alcohol Use: Yes  . Drug Use: No  . Sexually Active: Yes   Other Topics Concern  . Not on file   Social History Narrative  . No narrative on file   Family History  Problem Relation Age of Onset  . Lung cancer Mother   . Fibroids Sister   . Parkinsonism Maternal Grandmother   . Diabetes Maternal Grandmother   . Lung cancer Maternal Grandfather    Patient Active Problem List  Diagnoses  . Allergy  . LPRD (laryngopharyngeal reflux disease)  . PMS (premenstrual syndrome)  . Family history of lung cancer  . Pelvic mass in female   Current Outpatient Prescriptions on File Prior to Visit  Medication Sig Dispense Refill  . acetaminophen (TYLENOL) 500 MG tablet Take 1,000 mg by mouth every 6 (six) hours as needed. pain      . cetirizine (ZYRTEC) 10 MG tablet Take 10 mg by mouth daily as needed. allergies           Review of Systems    see HPI Objective:   Physical Exam  Physical Exam  Nursing note and vitals reviewed.  Constitutional: She is  oriented to person, place, and time. She appears well-developed and well-nourished.  HENT:  Head: Normocephalic and atraumatic.  Right Ear: Tympanic membrane and ear canal normal. No drainage. Tympanic membrane is not injected and not erythematous.  Left Ear: Tympanic membrane and ear canal normal. No drainage. Tympanic membrane is not injected and not erythematous.  Nose: Nose normal. Right sinus exhibits no maxillary sinus tenderness and no frontal sinus tenderness. Left sinus exhibits no maxillary sinus tenderness and no frontal sinus tenderness.  Mouth/Throat: Oropharynx is clear and moist. No oral lesions. No oropharyngeal exudate.  Eyes: Conjunctivae and EOM are normal. Pupils are equal, round, and reactive to light.  Neck: Normal range of motion. Neck supple. No JVD present. Carotid bruit is not present. No mass and no thyromegaly present.  Cardiovascular: Normal rate, regular rhythm, S1 normal, S2 normal and intact distal pulses. Exam reveals no gallop and no friction rub.  No murmur heard.  Pulses:  Carotid pulses are 2+ on the right side, and 2+ on the left side.  Dorsalis pedis pulses are 2+ on the right side, and 2+ on the left side.  No carotid bruit. No LE edema  Pulmonary/Chest: Breath sounds normal. She has no wheezes. She has no rales. She exhibits no tenderness.  Abdominal: Soft. Bowel sounds are normal. She exhibits no distension and no mass. There is no hepatosplenomegaly. There is no tenderness. There is no CVA tenderness.  Musculoskeletal: Normal range of motion.  No active synovitis to joints.  Lymphadenopathy:  She has no cervical adenopathy.  She has no axillary adenopathy.  Right: No inguinal and no supraclavicular adenopathy present.  Left: No inguinal and no supraclavicular adenopathy present.  Neurological: She is alert and oriented to person, place, and time. She has normal strength and normal reflexes. She displays no tremor. No cranial nerve deficit or sensory  deficit. Coordination and gait normal.  Skin: Skin is warm and dry. No rash noted. No cyanosis. Nails show no clubbing.  Psychiatric: She has a normal mood and affect. Her speech is normal and behavior is normal. Cognition and memory are normal.         Assessment & Plan:  1)  HM see scanned sheet.  She delcines Tdap now until after surgery 2)  Allergic rhinitis  Inactive problem now 3)  FH of lung CA  Will get CXR today  She will take with nurse navigator at [facility] about early CT screening.  Father had lung CA at age 71.   4)  Anemia  Improved.   5)  Pelvic mass surgical resection pending.

## 2011-07-02 NOTE — Patient Instructions (Signed)
See me as needed 

## 2011-07-07 NOTE — Progress Notes (Signed)
Pt notified of CXR results of being normal

## 2011-07-15 ENCOUNTER — Other Ambulatory Visit: Payer: Self-pay | Admitting: Obstetrics and Gynecology

## 2011-07-15 DIAGNOSIS — Z1231 Encounter for screening mammogram for malignant neoplasm of breast: Secondary | ICD-10-CM

## 2011-07-16 ENCOUNTER — Ambulatory Visit
Admission: RE | Admit: 2011-07-16 | Discharge: 2011-07-16 | Disposition: A | Discharge status: Home / Self Care | Payer: BC Managed Care – PPO | Source: Ambulatory Visit | Attending: Obstetrics and Gynecology | Admitting: Obstetrics and Gynecology

## 2011-07-16 DIAGNOSIS — Z1231 Encounter for screening mammogram for malignant neoplasm of breast: Secondary | ICD-10-CM

## 2011-07-21 ENCOUNTER — Telehealth: Payer: Self-pay | Admitting: *Deleted

## 2011-07-21 ENCOUNTER — Encounter: Payer: Self-pay | Admitting: Internal Medicine

## 2011-07-21 DIAGNOSIS — L219 Seborrheic dermatitis, unspecified: Secondary | ICD-10-CM | POA: Insufficient documentation

## 2011-07-21 DIAGNOSIS — L210 Seborrhea capitis: Secondary | ICD-10-CM

## 2011-07-21 MED ORDER — AMBULATORY NON FORMULARY MEDICATION: Status: DC

## 2011-07-21 MED ORDER — KETOCONAZOLE 2 % EX SHAM: MEDICATED_SHAMPOO | CUTANEOUS | Status: DC

## 2011-07-21 NOTE — Telephone Encounter (Signed)
Melissa Evans  I have never heard of Dermasmooth oil.  Would you call her pharmacy and have them fax to [place] and then I can order.  I sent Nizoral order already

## 2011-07-21 NOTE — Telephone Encounter (Signed)
Pt called and stated that she was having her surgery on Thursday and was very stressed over it.  When she is stressed she gets dermatitis dandruff.  She is having that really bad right now and is requesting that Dr Constance Goltz prescribe her Dermasmooth oil that she puts in her scalp and Naizarol shampoo.  She has used this in the past and works very good for her.  She is requesting that this be sent to [facility] in [facility].

## 2011-07-27 ENCOUNTER — Telehealth: Payer: Self-pay | Admitting: Internal Medicine

## 2011-07-27 NOTE — Telephone Encounter (Signed)
Spoke with pt .  She states surgery went well and Vivelle dot is very expensive  Ok to have minivelle

## 2011-07-30 ENCOUNTER — Telehealth: Payer: Self-pay | Admitting: Internal Medicine

## 2011-07-30 MED ORDER — ESTRADIOL 0.1 MG/24HR TD PTTW: 1.0000 | MEDICATED_PATCH | TRANSDERMAL | Status: DC

## 2011-07-30 NOTE — Telephone Encounter (Signed)
Lora   Let Geniece know that I sent the minivelle RX to [facility].  She can pick up discount coupon at office.  Have her call office first to be sure someone in here

## 2011-07-31 ENCOUNTER — Telehealth: Payer: Self-pay | Admitting: *Deleted

## 2011-07-31 NOTE — Telephone Encounter (Signed)
See above note

## 2011-08-10 ENCOUNTER — Other Ambulatory Visit: Payer: Self-pay | Admitting: Internal Medicine

## 2011-11-19 ENCOUNTER — Telehealth: Payer: Self-pay | Admitting: *Deleted

## 2011-11-19 DIAGNOSIS — K219 Gastro-esophageal reflux disease without esophagitis: Secondary | ICD-10-CM

## 2011-11-19 NOTE — Telephone Encounter (Signed)
Pt would like a referral to an ENT for LPR

## 2011-11-26 NOTE — Telephone Encounter (Signed)
Melissa Evans  I like Dr. Allegra Grana    OK to refer her for laryngopharyngeal reflux disease  And call pt back

## 2011-11-26 NOTE — Addendum Note (Signed)
Addended by: Raechel Chute D on: 11/26/2011 04:53 PM   Modules accepted: Orders

## 2011-12-04 ENCOUNTER — Telehealth: Payer: Self-pay | Admitting: *Deleted

## 2011-12-04 NOTE — Telephone Encounter (Signed)
Called pt with referral info.

## 2011-12-04 NOTE — Telephone Encounter (Signed)
appt made for ENT pt called and notified of appt 12/08/11 @10  am

## 2011-12-04 NOTE — Telephone Encounter (Signed)
Pt to see Dr Pollyann Kennedy 12/08/11@10am  pt notified of appt

## 2011-12-20 ENCOUNTER — Encounter: Payer: Self-pay | Admitting: Internal Medicine

## 2011-12-20 DIAGNOSIS — K219 Gastro-esophageal reflux disease without esophagitis: Secondary | ICD-10-CM | POA: Insufficient documentation

## 2012-02-10 HISTORY — PX: ABDOMINAL HYSTERECTOMY: SHX81

## 2012-03-13 ENCOUNTER — Other Ambulatory Visit: Payer: Self-pay | Admitting: Internal Medicine

## 2012-03-14 ENCOUNTER — Telehealth: Payer: Self-pay | Admitting: Internal Medicine

## 2012-03-14 NOTE — Telephone Encounter (Signed)
Melissa Evans    I do not recall prescribing this for her.  She had a hysterectomy at [facility] if I remember correctly.  Have her make an appt with me and bring any records from her surgery as I did not receive any.  She had a large pelvic mass

## 2012-03-14 NOTE — Telephone Encounter (Signed)
Pt would like a refill on her last medication that was prescribe to her with a coupon Minvelle or vivelle.. Pt not sure.. But it needs to be sent to [facility] [place]..    Also pt would like to talk to the doctor about her test levels of her hormones, since she had her hysterectomy last summer... Please call pt at [phone]

## 2012-03-14 NOTE — Telephone Encounter (Signed)
Refill request

## 2012-03-15 NOTE — Telephone Encounter (Signed)
Pt with appt on 2/27 at 3:00

## 2012-04-07 ENCOUNTER — Ambulatory Visit: Payer: BC Managed Care – PPO | Admitting: Internal Medicine

## 2012-04-14 ENCOUNTER — Encounter: Payer: Self-pay | Admitting: Internal Medicine

## 2012-04-14 ENCOUNTER — Ambulatory Visit (INDEPENDENT_AMBULATORY_CARE_PROVIDER_SITE_OTHER): Payer: BC Managed Care – PPO | Admitting: Internal Medicine

## 2012-04-14 VITALS — BP 126/74 | HR 75 | Temp 97.5°F | Resp 16 | Ht 67.0 in | Wt 191.0 lb

## 2012-04-14 DIAGNOSIS — N839 Noninflammatory disorder of ovary, fallopian tube and broad ligament, unspecified: Secondary | ICD-10-CM

## 2012-04-14 DIAGNOSIS — E8941 Symptomatic postprocedural ovarian failure: Secondary | ICD-10-CM

## 2012-04-14 DIAGNOSIS — R454 Irritability and anger: Secondary | ICD-10-CM

## 2012-04-14 DIAGNOSIS — N838 Other noninflammatory disorders of ovary, fallopian tube and broad ligament: Secondary | ICD-10-CM | POA: Insufficient documentation

## 2012-04-14 DIAGNOSIS — E8940 Asymptomatic postprocedural ovarian failure: Secondary | ICD-10-CM | POA: Insufficient documentation

## 2012-04-14 LAB — CBC WITH DIFFERENTIAL/PLATELET
Eosinophils Relative: 8 % — ABNORMAL HIGH (ref 0–5)
HCT: 35.2 % — ABNORMAL LOW (ref 36.0–46.0)
Hemoglobin: 11.5 g/dL — ABNORMAL LOW (ref 12.0–15.0)
Lymphocytes Relative: 38 % (ref 12–46)
Lymphs Abs: 3.1 10*3/uL (ref 0.7–4.0)
MCV: 80.5 fL (ref 78.0–100.0)
Platelets: 286 10*3/uL (ref 150–400)
RBC: 4.37 MIL/uL (ref 3.87–5.11)
WBC: 8.1 10*3/uL (ref 4.0–10.5)

## 2012-04-14 LAB — TSH: TSH: 1.534 u[IU]/mL (ref 0.350–4.500)

## 2012-04-14 LAB — T4, FREE: Free T4: 1.23 ng/dL (ref 0.80–1.80)

## 2012-04-14 MED ORDER — ESTRADIOL 0.1 MG/24HR TD PTTW: 1.0000 | MEDICATED_PATCH | TRANSDERMAL | Status: DC

## 2012-04-14 NOTE — Patient Instructions (Addendum)
Schedule CPE with me 

## 2012-04-14 NOTE — Progress Notes (Signed)
Subjective:    Patient ID: Melissa Evans, female    DOB: September 06, 1970, 42 y.o.   MRN: [id number]  HPI Melissa Evans is here for follow up.  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  She is on mInivelle 0.1 mg and feeling good except occasional irritability .  No depressed mood  No hot flushes  No Known Allergies Past Medical History  Diagnosis Date  . Allergy   . LPRD (laryngopharyngeal reflux disease)   . PMS (premenstrual syndrome)    Past Surgical History  Procedure Laterality Date  . Left oophorectomy    . Wisdom tooth extraction    . Abdominal hysterectomy     History   Social History  . Marital Status: Married    Spouse Name: N/A    Number of Children: N/A  . Years of Education: N/A   Occupational History  . Not on file.   Social History Main Topics  . Smoking status: Never Smoker   . Smokeless tobacco: Never Used  . Alcohol Use: Yes  . Drug Use: No  . Sexually Active: Yes   Other Topics Concern  . Not on file   Social History Narrative  . No narrative on file   Family History  Problem Relation Age of Onset  . Lung cancer Mother   . Fibroids Sister   . Parkinsonism Maternal Grandmother   . Diabetes Maternal Grandmother   . Lung cancer Maternal Grandfather    Patient Active Problem List  Diagnosis  . Allergy  . LPRD (laryngopharyngeal reflux disease)  . PMS (premenstrual syndrome)  . Family history of lung cancer  . Pelvic mass in female  . Seborrheic dermatitis of scalp  . GERD (gastroesophageal reflux disease)  . Surgical menopause  . Ovarian mass   Current Outpatient Prescriptions on File Prior to Visit  Medication Sig Dispense Refill  . cetirizine (ZYRTEC) 10 MG tablet Take 10 mg by mouth daily as needed. allergies      . Ibuprofen (ADVIL) 200 MG CAPS Take 1,000 mg by mouth 2 (two) times daily.      Marland Kitchen acetaminophen (TYLENOL) 500 MG tablet Take 1,000 mg by mouth every 6 (six) hours as needed. pain      .  AMBULATORY NON FORMULARY MEDICATION Dermasmooth Scalp Oil.  Use as directed  1 Bottle  1  . ketoconazole (NIZORAL) 2 % shampoo APPLY TOPICALLY 2 (TWO) TIMES A WEEK.  120 mL  2   No current facility-administered medications on file prior to visit.            Review of Systems    see HPI Objective:   Physical Exam Physical Exam  Nursing note and vitals reviewed.  Constitutional: She is oriented to person, place, and time. She appears well-developed and well-nourished.  HENT:  Head: Normocephalic and atraumatic.  Cardiovascular: Normal rate and regular rhythm. Exam reveals no gallop and no friction rub.  No murmur heard.  Pulmonary/Chest: Breath sounds normal. She has no wheezes. She has no rales.  Neurological: She is alert and oriented to person, place, and time.  Skin: Skin is warm and dry.  Psychiatric: She has a normal mood and affect. Her behavior is normal.             Assessment & Plan:  Surgical menopause  Will continue minivelle  At current dose  Check Thyroid blood tests  Irritability see above  Check labs and schedule CPe with me

## 2012-04-15 LAB — COMPREHENSIVE METABOLIC PANEL
ALT: 15 U/L (ref 0–35)
CO2: 27 mEq/L (ref 19–32)
Calcium: 9.9 mg/dL (ref 8.4–10.5)
Chloride: 102 mEq/L (ref 96–112)
Creat: 0.65 mg/dL (ref 0.50–1.10)
Total Protein: 7.6 g/dL (ref 6.0–8.3)

## 2012-04-15 LAB — VITAMIN D 25 HYDROXY (VIT D DEFICIENCY, FRACTURES): Vit D, 25-Hydroxy: 14 ng/mL — ABNORMAL LOW (ref 30–89)

## 2012-04-15 LAB — LIPID PANEL: Cholesterol: 156 mg/dL (ref 0–200)

## 2012-04-17 ENCOUNTER — Encounter: Payer: Self-pay | Admitting: Internal Medicine

## 2012-04-17 ENCOUNTER — Other Ambulatory Visit: Payer: Self-pay | Admitting: Internal Medicine

## 2012-04-17 DIAGNOSIS — D649 Anemia, unspecified: Secondary | ICD-10-CM | POA: Insufficient documentation

## 2012-04-18 ENCOUNTER — Telehealth: Payer: Self-pay | Admitting: Internal Medicine

## 2012-04-18 NOTE — Telephone Encounter (Signed)
Pt states that she has a rash on her chest pt is requesting  RN visit explained to pt that since it is new onset that it must be seen by MD pt voiced understanding and will call back for an appt

## 2012-04-18 NOTE — Telephone Encounter (Signed)
Pt states she wants to discuss the rash that's on the front of her neck... She states she mention while she was there but forgot to talk about it with the doctor.Marland Kitchen She would like to know if she can do a nurse  Visit possible,... Advise I was not sure; message would be given to nurse.Marland Kitchen Please call pt at [phone]

## 2012-04-20 ENCOUNTER — Other Ambulatory Visit: Payer: Self-pay | Admitting: Internal Medicine

## 2012-04-20 DIAGNOSIS — E559 Vitamin D deficiency, unspecified: Secondary | ICD-10-CM | POA: Insufficient documentation

## 2012-04-20 MED ORDER — VITAMIN D3 1.25 MG (50000 UT) PO CAPS: 1.0000 | ORAL_CAPSULE | ORAL | Status: AC

## 2012-04-21 ENCOUNTER — Encounter: Payer: Self-pay | Admitting: *Deleted

## 2012-04-21 ENCOUNTER — Telehealth: Payer: Self-pay | Admitting: *Deleted

## 2012-04-21 NOTE — Telephone Encounter (Signed)
Left message regarding Vit D levels and RX

## 2012-04-21 NOTE — Telephone Encounter (Signed)
Message copied by Mathews Robinsons on Thu Apr 21, 2012  8:30 AM ------      Message from: Raechel Chute D      Created: Wed Apr 20, 2012 12:58 PM       Karen Kitchens            Call pt and let her know that her vitamin D level is very low.  I will send a RX to her pharmacy .  She is to take 50,000 units of vitamin D once a week for 8 weeks and then take OTC 2000 units a day.  I will recheck her vitamin D at her June CPE visit ------

## 2012-05-09 ENCOUNTER — Telehealth: Payer: Self-pay | Admitting: Internal Medicine

## 2012-05-09 MED ORDER — SCOPOLAMINE 1 MG/3DAYS TD PT72: MEDICATED_PATCH | TRANSDERMAL | Status: DC

## 2012-05-09 NOTE — Telephone Encounter (Signed)
See Melissa Evans's note

## 2012-05-09 NOTE — Telephone Encounter (Signed)
Pt needs SCHOPOLAMINE 1.5 MG SENT TO [facility] [address]... SHE NEEDS IT TODAY SHE IS LEAVING TODAY GOING OUT OF TOWN.Marland Kitchen

## 2012-05-23 ENCOUNTER — Ambulatory Visit (INDEPENDENT_AMBULATORY_CARE_PROVIDER_SITE_OTHER): Payer: BC Managed Care – PPO | Admitting: Internal Medicine

## 2012-05-23 ENCOUNTER — Encounter: Payer: Self-pay | Admitting: Internal Medicine

## 2012-05-23 VITALS — BP 122/83 | HR 72 | Temp 97.6°F | Resp 18 | Wt 185.0 lb

## 2012-05-23 DIAGNOSIS — K529 Noninfective gastroenteritis and colitis, unspecified: Secondary | ICD-10-CM

## 2012-05-23 DIAGNOSIS — R21 Rash and other nonspecific skin eruption: Secondary | ICD-10-CM

## 2012-05-23 DIAGNOSIS — K5289 Other specified noninfective gastroenteritis and colitis: Secondary | ICD-10-CM

## 2012-05-23 DIAGNOSIS — J209 Acute bronchitis, unspecified: Secondary | ICD-10-CM

## 2012-05-23 MED ORDER — AZITHROMYCIN 250 MG PO TABS: ORAL_TABLET | ORAL | Status: DC

## 2012-05-23 MED ORDER — HYDROCORTISONE 2.5 % EX CREA: TOPICAL_CREAM | Freq: Two times a day (BID) | CUTANEOUS | Status: DC

## 2012-05-23 NOTE — Progress Notes (Signed)
Subjective:    Patient ID: Melissa Evans, female    DOB: 1970/08/25, 42 y.o.   MRN: [id number]  HPI Melissa Evans is here for acute visit.  She had 10 days of head cold and cough productive of white sputum.  It seems to be getting better now  Both her daughters and vomiting and diarrhea illness.  Pt had vomiting over weekend but resolved now  Itchy skin rash on neck and white spot on forehead  No Known Allergies Past Medical History  Diagnosis Date  . Allergy   . LPRD (laryngopharyngeal reflux disease)   . PMS (premenstrual syndrome)    Past Surgical History  Procedure Laterality Date  . Left oophorectomy    . Wisdom tooth extraction    . Abdominal hysterectomy     History   Social History  . Marital Status: Married    Spouse Name: N/A    Number of Children: N/A  . Years of Education: N/A   Occupational History  . Not on file.   Social History Main Topics  . Smoking status: Never Smoker   . Smokeless tobacco: Never Used  . Alcohol Use: Yes  . Drug Use: No  . Sexually Active: Yes   Other Topics Concern  . Not on file   Social History Narrative  . No narrative on file   Family History  Problem Relation Age of Onset  . Lung cancer Mother   . Fibroids Sister   . Parkinsonism Maternal Grandmother   . Diabetes Maternal Grandmother   . Lung cancer Maternal Grandfather    Patient Active Problem List  Diagnosis  . Allergy  . LPRD (laryngopharyngeal reflux disease)  . PMS (premenstrual syndrome)  . Family history of lung cancer  . Pelvic mass in female  . Seborrheic dermatitis of scalp  . GERD (gastroesophageal reflux disease)  . Surgical menopause  . Ovarian mass  . Anemia  . Unspecified vitamin D deficiency   Current Outpatient Prescriptions on File Prior to Visit  Medication Sig Dispense Refill  . acetaminophen (TYLENOL) 500 MG tablet Take 1,000 mg by mouth every 6 (six) hours as needed. pain      . cetirizine (ZYRTEC) 10 MG tablet Take 10 mg by mouth daily  as needed. allergies      . Cholecalciferol (VITAMIN D3) 50000 UNITS CAPS Take 1 capsule by mouth once a week.  8 capsule  0  . estradiol (MINIVELLE) 0.1 MG/24HR Place 1 patch (0.1 mg total) onto the skin 2 (two) times a week.  24 patch  0  . Ibuprofen (ADVIL) 200 MG CAPS Take 1,000 mg by mouth 2 (two) times daily.      Marland Kitchen scopolamine (TRANSDERM-SCOP) 1.5 MG Place one patch behind ear one time for motion sickness  1 patch  1   No current facility-administered medications on file prior to visit.       Review of Systems    see HPI Objective:   Physical Exam  Physical Exam  Nursing note and vitals reviewed.  Constitutional: She is oriented to person, place, and time. She appears well-developed and well-nourished.  HENT:  Head: Normocephalic and atraumatic.  Cardiovascular: Normal rate and regular rhythm. Exam reveals no gallop and no friction rub.  No murmur heard.  Pulmonary/Chest: Breath sounds normal. She has no wheezes. She has no rales.  ABDF soft nontender nondistended Neurological: She is alert and oriented to person, place, and time.  Skin: Skin is warm and dry.   She has  Slight scaly hypopigmented rash on face  Psychiatric: She has a normal mood and affect. Her behavior is normal.            Assessment & Plan:  Bronchitis  Z-pak    Gastroenteritis   Improving  Bland diet for now avoid dailry  Skin  Rash  ???  Tinea versicolor  Vs seb dermatitis   Will refer to dermatology  HC 2.5%  skin

## 2012-05-24 ENCOUNTER — Telehealth: Payer: Self-pay | Admitting: Internal Medicine

## 2012-05-24 LAB — VITAMIN B12: Vitamin B-12: 747 pg/mL (ref 211–911)

## 2012-05-24 NOTE — Telephone Encounter (Signed)
Pt called back at 11:23 am for referral appt scheduled.  Pt aware of scheduled appt Doctor, date, time, location and phone number.

## 2012-05-24 NOTE — Telephone Encounter (Signed)
Made Pt appt with Dr Sharyn Lull for May 25, 2012 at 11:00 am.  Tried to call pt thru cell not set up.  Continue to call pt.  KH

## 2012-05-26 ENCOUNTER — Encounter: Payer: Self-pay | Admitting: *Deleted

## 2012-07-13 ENCOUNTER — Other Ambulatory Visit: Payer: Self-pay | Admitting: *Deleted

## 2012-07-13 NOTE — Telephone Encounter (Signed)
Patient called back and also needs a Rx for her Viville patch. She said there were 2 forms, and she wanted whichever one that dr Constance Goltz had the coupon for.

## 2012-07-13 NOTE — Telephone Encounter (Signed)
Needs refill of hydrocortisone cream called into [facility]

## 2012-07-13 NOTE — Telephone Encounter (Signed)
Refill request

## 2012-07-14 ENCOUNTER — Telehealth: Payer: Self-pay | Admitting: *Deleted

## 2012-07-14 MED ORDER — HYDROCORTISONE 2.5 % EX CREA: TOPICAL_CREAM | Freq: Two times a day (BID) | CUTANEOUS | Status: DC

## 2012-07-14 MED ORDER — ESTRADIOL 0.1 MG/24HR TD PTTW: 1.0000 | MEDICATED_PATCH | TRANSDERMAL | Status: DC

## 2012-07-14 NOTE — Telephone Encounter (Signed)
Hydrocortisone cream called in to [facility]

## 2012-07-21 ENCOUNTER — Encounter: Payer: BC Managed Care – PPO | Admitting: Internal Medicine

## 2012-10-19 ENCOUNTER — Encounter: Payer: BC Managed Care – PPO | Admitting: Internal Medicine

## 2012-10-19 DIAGNOSIS — Z0000 Encounter for general adult medical examination without abnormal findings: Secondary | ICD-10-CM

## 2012-12-02 ENCOUNTER — Other Ambulatory Visit: Payer: Self-pay

## 2012-12-02 DIAGNOSIS — Z1231 Encounter for screening mammogram for malignant neoplasm of breast: Secondary | ICD-10-CM

## 2012-12-07 ENCOUNTER — Ambulatory Visit (HOSPITAL_COMMUNITY)
Admission: RE | Admit: 2012-12-07 | Discharge: 2012-12-07 | Disposition: A | Discharge status: Home / Self Care | Payer: BC Managed Care – PPO | Source: Ambulatory Visit | Attending: Internal Medicine | Admitting: Internal Medicine

## 2012-12-07 DIAGNOSIS — Z1231 Encounter for screening mammogram for malignant neoplasm of breast: Secondary | ICD-10-CM

## 2012-12-16 ENCOUNTER — Ambulatory Visit: Payer: Self-pay | Admitting: Podiatry

## 2013-01-02 ENCOUNTER — Ambulatory Visit (INDEPENDENT_AMBULATORY_CARE_PROVIDER_SITE_OTHER): Payer: BC Managed Care – PPO | Admitting: Podiatry

## 2013-01-02 ENCOUNTER — Encounter: Payer: Self-pay | Admitting: Podiatry

## 2013-01-02 ENCOUNTER — Ambulatory Visit (INDEPENDENT_AMBULATORY_CARE_PROVIDER_SITE_OTHER): Payer: BC Managed Care – PPO

## 2013-01-02 VITALS — BP 110/60 | HR 84 | Resp 18

## 2013-01-02 DIAGNOSIS — M79609 Pain in unspecified limb: Secondary | ICD-10-CM

## 2013-01-02 DIAGNOSIS — M722 Plantar fascial fibromatosis: Secondary | ICD-10-CM

## 2013-01-02 MED ORDER — TRIAMCINOLONE ACETONIDE 10 MG/ML IJ SUSP
10.0000 mg | Freq: Once | INTRAMUSCULAR | Status: AC
  Administered 2013-01-02: 10 mg

## 2013-01-02 MED ORDER — MELOXICAM 15 MG PO TABS: 15.0000 mg | ORAL_TABLET | Freq: Every day | ORAL | Status: DC

## 2013-01-02 NOTE — Progress Notes (Signed)
°  Subjective:    Patient ID: Melissa Evans, female    DOB: [birth year], 42 y.o.   MRN: [id number]  HPI my heel on the right is hurting and i do have some inserts and got earth shoes and that is when i noticed the pain after a while of wearing them and hurts on the bottom and tender and sore and hard for me to get up and the right is hurting more and has been hurting for a month.    Review of Systems  Constitutional: Negative.   HENT: Positive for sinus pressure.        SORE THROAT   Eyes: Negative.   Respiratory: Negative.   Cardiovascular: Negative.   Gastrointestinal: Negative.   Endocrine: Negative.   Genitourinary: Negative.   Musculoskeletal:       JOINT PAIN AND DIFFICULTY WALKING  Skin: Negative.   Allergic/Immunologic: Positive for environmental allergies.  Neurological: Negative.   Hematological: Negative.   Psychiatric/Behavioral: Negative.        Objective:   Physical Exam        Assessment & Plan:

## 2013-01-02 NOTE — Patient Instructions (Signed)
Plantar Fasciitis (Heel Spur Syndrome)  with Rehab  The plantar fascia is a fibrous, ligament-like, soft-tissue structure that spans the bottom of the foot. Plantar fasciitis is a condition that causes pain in the foot due to inflammation of the tissue.  SYMPTOMS   · Pain and tenderness on the underneath side of the foot.  · Pain that worsens with standing or walking.  CAUSES   Plantar fasciitis is caused by irritation and injury to the plantar fascia on the underneath side of the foot. Common mechanisms of injury include:  · Direct trauma to bottom of the foot.  · Damage to a small nerve that runs under the foot where the main fascia attaches to the heel bone.  · Stress placed on the plantar fascia due to bone spurs.  RISK INCREASES WITH:   · Activities that place stress on the plantar fascia (running, jumping, pivoting, or cutting).  · Poor strength and flexibility.  · Improperly fitted shoes.  · Tight calf muscles.  · Flat feet.  · Failure to warm-up properly before activity.  · Obesity.  PREVENTION  · Warm up and stretch properly before activity.  · Allow for adequate recovery between workouts.  · Maintain physical fitness:  · Strength, flexibility, and endurance.  · Cardiovascular fitness.  · Maintain a health body weight.  · Avoid stress on the plantar fascia.  · Wear properly fitted shoes, including arch supports for individuals who have flat feet.  PROGNOSIS   If treated properly, then the symptoms of plantar fasciitis usually resolve without surgery. However, occasionally surgery is necessary.  RELATED COMPLICATIONS   · Recurrent symptoms that may result in a chronic condition.  · Problems of the lower back that are caused by compensating for the injury, such as limping.  · Pain or weakness of the foot during push-off following surgery.  · Chronic inflammation, scarring, and partial or complete fascia tear, occurring more often from repeated injections.  TREATMENT   Treatment initially involves the use of  ice and medication to help reduce pain and inflammation. The use of strengthening and stretching exercises may help reduce pain with activity, especially stretches of the Achilles tendon. These exercises may be performed at home or with a therapist. Your caregiver may recommend that you use heel cups of arch supports to help reduce stress on the plantar fascia. Occasionally, corticosteroid injections are given to reduce inflammation. If symptoms persist for greater than 6 months despite non-surgical (conservative), then surgery may be recommended.   MEDICATION   · If pain medication is necessary, then nonsteroidal anti-inflammatory medications, such as aspirin and ibuprofen, or other minor pain relievers, such as acetaminophen, are often recommended.  · Do not take pain medication within 7 days before surgery.  · Prescription pain relievers may be given if deemed necessary by your caregiver. Use only as directed and only as much as you need.  · Corticosteroid injections may be given by your caregiver. These injections should be reserved for the most serious cases, because they may only be given a certain number of times.  HEAT AND COLD  · Cold treatment (icing) relieves pain and reduces inflammation. Cold treatment should be applied for 10 to 15 minutes every 2 to 3 hours for inflammation and pain and immediately after any activity that aggravates your symptoms. Use ice packs or massage the area with a piece of ice (ice massage).  · Heat treatment may be used prior to performing the stretching and strengthening activities prescribed   by your caregiver, physical therapist, or athletic trainer. Use a heat pack or soak the injury in warm water.  SEEK IMMEDIATE MEDICAL CARE IF:  · Treatment seems to offer no benefit, or the condition worsens.  · Any medications produce adverse side effects.  EXERCISES  RANGE OF MOTION (ROM) AND STRETCHING EXERCISES - Plantar Fasciitis (Heel Spur Syndrome)  These exercises may help you  when beginning to rehabilitate your injury. Your symptoms may resolve with or without further involvement from your physician, physical therapist or athletic trainer. While completing these exercises, remember:   · Restoring tissue flexibility helps normal motion to return to the joints. This allows healthier, less painful movement and activity.  · An effective stretch should be held for at least 30 seconds.  · A stretch should never be painful. You should only feel a gentle lengthening or release in the stretched tissue.  RANGE OF MOTION - Toe Extension, Flexion  · Sit with your right / left leg crossed over your opposite knee.  · Grasp your toes and gently pull them back toward the top of your foot. You should feel a stretch on the bottom of your toes and/or foot.  · Hold this stretch for __________ seconds.  · Now, gently pull your toes toward the bottom of your foot. You should feel a stretch on the top of your toes and or foot.  · Hold this stretch for __________ seconds.  Repeat __________ times. Complete this stretch __________ times per day.   RANGE OF MOTION - Ankle Dorsiflexion, Active Assisted  · Remove shoes and sit on a chair that is preferably not on a carpeted surface.  · Place right / left foot under knee. Extend your opposite leg for support.  · Keeping your heel down, slide your right / left foot back toward the chair until you feel a stretch at your ankle or calf. If you do not feel a stretch, slide your bottom forward to the edge of the chair, while still keeping your heel down.  · Hold this stretch for __________ seconds.  Repeat __________ times. Complete this stretch __________ times per day.   STRETCH  Gastroc, Standing  · Place hands on wall.  · Extend right / left leg, keeping the front knee somewhat bent.  · Slightly point your toes inward on your back foot.  · Keeping your right / left heel on the floor and your knee straight, shift your weight toward the wall, not allowing your back to  arch.  · You should feel a gentle stretch in the right / left calf. Hold this position for __________ seconds.  Repeat __________ times. Complete this stretch __________ times per day.  STRETCH  Soleus, Standing  · Place hands on wall.  · Extend right / left leg, keeping the other knee somewhat bent.  · Slightly point your toes inward on your back foot.  · Keep your right / left heel on the floor, bend your back knee, and slightly shift your weight over the back leg so that you feel a gentle stretch deep in your back calf.  · Hold this position for __________ seconds.  Repeat __________ times. Complete this stretch __________ times per day.  STRETCH  Gastrocsoleus, Standing   Note: This exercise can place a lot of stress on your foot and ankle. Please complete this exercise only if specifically instructed by your caregiver.   · Place the ball of your right / left foot on a step, keeping   your other foot firmly on the same step.  · Hold on to the wall or a rail for balance.  · Slowly lift your other foot, allowing your body weight to press your heel down over the edge of the step.  · You should feel a stretch in your right / left calf.  · Hold this position for __________ seconds.  · Repeat this exercise with a slight bend in your right / left knee.  Repeat __________ times. Complete this stretch __________ times per day.   STRENGTHENING EXERCISES - Plantar Fasciitis (Heel Spur Syndrome)   These exercises may help you when beginning to rehabilitate your injury. They may resolve your symptoms with or without further involvement from your physician, physical therapist or athletic trainer. While completing these exercises, remember:   · Muscles can gain both the endurance and the strength needed for everyday activities through controlled exercises.  · Complete these exercises as instructed by your physician, physical therapist or athletic trainer. Progress the resistance and repetitions only as guided.  STRENGTH - Towel  Curls  · Sit in a chair positioned on a non-carpeted surface.  · Place your foot on a towel, keeping your heel on the floor.  · Pull the towel toward your heel by only curling your toes. Keep your heel on the floor.  · If instructed by your physician, physical therapist or athletic trainer, add ____________________ at the end of the towel.  Repeat __________ times. Complete this exercise __________ times per day.  STRENGTH - Ankle Inversion  · Secure one end of a rubber exercise band/tubing to a fixed object (table, pole). Loop the other end around your foot just before your toes.  · Place your fists between your knees. This will focus your strengthening at your ankle.  · Slowly, pull your big toe up and in, making sure the band/tubing is positioned to resist the entire motion.  · Hold this position for __________ seconds.  · Have your muscles resist the band/tubing as it slowly pulls your foot back to the starting position.  Repeat __________ times. Complete this exercises __________ times per day.   Document Released: 01/26/2005 Document Revised: 04/20/2011 Document Reviewed: 05/10/2008  [employer]® Patient Information ©2014 [employer].

## 2013-01-02 NOTE — Progress Notes (Deleted)
  Subjective:    Patient ID: Melissa Evans, female    DOB: 21-Jun-1970, 42 y.o.   MRN: [id number]  HPI    Review of Systems     Objective:   Physical Exam        Assessment & Plan:

## 2013-01-03 NOTE — Progress Notes (Signed)
Subjective:     Patient ID: Melissa Evans, female   DOB: January 25, 1971, 42 y.o.   MRN: [id number]  HPI patient states my heels are sore and have been this way for a while. She switched to different types of shoes and it seemed to get worse with earth shoes   Review of Systems  All other systems reviewed and are negative.       Objective:   Physical Exam  Nursing note and vitals reviewed. Constitutional: She is oriented to person, place, and time.  Cardiovascular: Intact distal pulses.   Musculoskeletal: Normal range of motion.  Neurological: She is oriented to person, place, and time.  Skin: Skin is warm.   patient is found to have pain in the plantar aspect heel right over left with inflammation and fluid around the medial calcaneal fascia insertion. Neurovascular status intact with no muscle strength loss noted     Assessment:     Plantar fasciitis he'll right over left was structural abnormality part of the problem she experiences    Plan:     H&P and x-ray performed. Injected the right plantar fascia 3 mg Kenalog 5 mg Xylocaine Marcaine mixture and applied fascially brace with instructions on usage. Place patient on Voltaren 75 mg twice a day and reappoint in 2 weeks

## 2013-01-09 ENCOUNTER — Ambulatory Visit: Payer: BC Managed Care – PPO | Admitting: Podiatry

## 2013-01-10 ENCOUNTER — Ambulatory Visit: Payer: BC Managed Care – PPO

## 2013-01-12 ENCOUNTER — Ambulatory Visit: Payer: BC Managed Care – PPO | Admitting: Podiatry

## 2013-01-19 ENCOUNTER — Ambulatory Visit: Payer: BC Managed Care – PPO | Admitting: Podiatry

## 2013-01-25 ENCOUNTER — Encounter: Payer: Self-pay | Admitting: Podiatry

## 2013-01-25 ENCOUNTER — Ambulatory Visit: Payer: BC Managed Care – PPO | Admitting: Podiatry

## 2013-01-25 VITALS — BP 113/70 | HR 73 | Resp 16

## 2013-01-25 DIAGNOSIS — M722 Plantar fascial fibromatosis: Secondary | ICD-10-CM

## 2013-01-26 NOTE — Progress Notes (Signed)
Subjective:     Patient ID: Melissa Evans, female   DOB: 13-Oct-1970, 42 y.o.   MRN: [id number]  HPI patient states my heels are doing much better but I know I have had this problem for a long time and my arches feel unstable   Review of Systems     Objective:   Physical Exam Neurovascular status and health history unchanged with significant improvement to the plantar heels of both feet with mild discomfort noted upon deep compression    Assessment:     Plantar fasciitis improving quite well but long-term history of condition    Plan:     Reviewed acute and chronic nature of plantar fasciitis and at this time scanned for custom orthotics to reduce the stress against the plantar heels. Patient will be seen back when they are ready

## 2013-03-15 ENCOUNTER — Telehealth: Payer: Self-pay | Admitting: *Deleted

## 2013-06-08 NOTE — Telephone Encounter (Signed)
error 

## 2013-06-13 ENCOUNTER — Telehealth: Payer: Self-pay | Admitting: *Deleted

## 2013-06-13 NOTE — Telephone Encounter (Signed)
Having a flare up of Plantar Fasciitis.  Need to come in and get that taken care of.  Inserts are there.  Is it beneficial to wait to be seen or will wearing the inserts today and tomorrow be beneficial?  I need some advice.  I called and informed her she will need to schedule an appointment to pick up orthotics.  She said she already scheduled for Thursday.  She asked if she should get the orthotics before Thursday and start wearing them.  I told her no, he likes to make sure they fit properly before dispensing them to avoid further damage to the feet.  She stated okay see you Thursday.

## 2013-06-15 ENCOUNTER — Ambulatory Visit (INDEPENDENT_AMBULATORY_CARE_PROVIDER_SITE_OTHER): Payer: BC Managed Care – PPO | Admitting: Podiatry

## 2013-06-15 ENCOUNTER — Encounter: Payer: Self-pay | Admitting: Podiatry

## 2013-06-15 VITALS — BP 130/83 | HR 83 | Resp 16

## 2013-06-15 DIAGNOSIS — M722 Plantar fascial fibromatosis: Secondary | ICD-10-CM

## 2013-06-15 MED ORDER — TRIAMCINOLONE ACETONIDE 10 MG/ML IJ SUSP
10.0000 mg | Freq: Once | INTRAMUSCULAR | Status: AC
  Administered 2013-06-15: 10 mg

## 2013-06-15 NOTE — Progress Notes (Signed)
   Subjective:    Patient ID: Melissa Evans, female    DOB: December 25, 1970, 43 y.o.   MRN: [id number]  HPI  Pt states that her right foot was hurting, plantar fascitis, PUO  Review of Systems     Objective:   Physical Exam        Assessment & Plan:

## 2013-06-15 NOTE — Progress Notes (Signed)
Subjective:     Patient ID: Melissa Evans, female   DOB: [birth year], 43 y.o.   MRN: [id number]  HPI patient states she is having a lot of pain in the bottom of the right heel and that she's having trouble when she gets up in the morning or after periods of sitting   Review of Systems     Objective:   Physical Exam Neurovascular status intact with no health history changes noted and exquisite discomfort plantar aspect right heel medial and central band    Assessment:     Plantar fasciitis medial and central band right heel that continues to be a problem especially after rest    Plan:     Reviewed condition and since the medial skin is getting slightly irritated I injected the plantar fascia from the lateral side and then dispensed night splint with all instructions on usage and dispensed orthotics with instructions on usage

## 2013-06-15 NOTE — Patient Instructions (Signed)

## 2013-07-13 ENCOUNTER — Encounter: Payer: Self-pay | Admitting: Podiatry

## 2013-07-13 ENCOUNTER — Ambulatory Visit (INDEPENDENT_AMBULATORY_CARE_PROVIDER_SITE_OTHER): Payer: BC Managed Care – PPO | Admitting: Podiatry

## 2013-07-13 VITALS — BP 123/59 | HR 89 | Resp 16

## 2013-07-13 DIAGNOSIS — M722 Plantar fascial fibromatosis: Secondary | ICD-10-CM

## 2013-07-13 NOTE — Progress Notes (Signed)
Subjective:     Patient ID: Melissa Evans, female   DOB: 02/11/1970, 43 y.o.   MRN: [id number]  HPI patient states I did better with that last shot still having pain but improved   Review of Systems     Objective:   Physical Exam Neurovascular status intact with significant reduction discomfort plantar heel upon palpation right    Assessment:     Improvement plantar fasciitis right with numerous conservative treatments    Plan:     Advised on continuation of orthotics anti-inflammatories supportive shoe and physical therapy. Reappoint her recheck as needed

## 2013-08-24 ENCOUNTER — Other Ambulatory Visit: Payer: Self-pay | Admitting: *Deleted

## 2013-08-24 ENCOUNTER — Telehealth: Payer: Self-pay | Admitting: *Deleted

## 2013-08-24 ENCOUNTER — Other Ambulatory Visit: Payer: Self-pay | Admitting: Internal Medicine

## 2013-08-24 NOTE — Telephone Encounter (Signed)
Pt needs refill on medicated shampoo - see pending order

## 2013-08-24 NOTE — Telephone Encounter (Signed)
Sent req to dr Kathie RhodesS for approval

## 2013-08-24 NOTE — Telephone Encounter (Signed)
Would like refills on DermaSmooth Scalp Oil 0.01% & Nizerol Shampoo  [facility]

## 2013-08-27 MED ORDER — SCOPOLAMINE 1 MG/3DAYS TD PT72: MEDICATED_PATCH | TRANSDERMAL | Status: AC

## 2013-08-28 ENCOUNTER — Telehealth: Payer: Self-pay | Admitting: *Deleted

## 2013-08-28 NOTE — Telephone Encounter (Signed)
Needs refill on Minivelle.

## 2013-08-29 ENCOUNTER — Other Ambulatory Visit: Payer: Self-pay | Admitting: *Deleted

## 2013-08-29 ENCOUNTER — Other Ambulatory Visit: Payer: Self-pay | Admitting: Internal Medicine

## 2013-08-29 NOTE — Telephone Encounter (Signed)
Sending request to DR.

## 2013-08-29 NOTE — Telephone Encounter (Signed)
Refill request

## 2013-08-30 ENCOUNTER — Other Ambulatory Visit: Payer: Self-pay | Admitting: Internal Medicine

## 2013-08-30 NOTE — Telephone Encounter (Signed)
Requested Medications     Medication name:  Name from pharmacy:  MINIVELLE 0.1 MG/24HR patch  MINIVELLE 0.1MG /24HR PAT    Sig: PLACE 1 PATCH (0.1 MG TOTAL) ONTO THE SKIN 2 (TWO) TIMES A WEEK.    Dispense: Not specified (Pharmacy requested 8 each) Refills: 0 Start: 08/29/2013  Class: Normal    Notes to pharmacy: Rx has expired - unused refills remain    Requested on: 07/14/2012    Originally ordered on: 07/30/2011 Last refill: 05/10/2013 Order History and Details

## 2013-08-31 NOTE — Telephone Encounter (Signed)
Melissa Evans  Call pt and let her know that I need to see her once a year when she is on this medication  I refill ed her patch for 30 days.  Schedule her a 30 min office visit to see me sometime before the next 30 days

## 2013-09-08 ENCOUNTER — Encounter: Payer: Self-pay | Admitting: Podiatrist

## 2013-09-08 ENCOUNTER — Ambulatory Visit (INDEPENDENT_AMBULATORY_CARE_PROVIDER_SITE_OTHER): Payer: BC Managed Care – PPO | Admitting: Podiatrist

## 2013-09-08 VITALS — BP 125/80 | HR 95 | Resp 14 | Ht 67.5 in | Wt 180.0 lb

## 2013-09-08 DIAGNOSIS — M722 Plantar fascial fibromatosis: Secondary | ICD-10-CM

## 2013-09-08 MED ORDER — TRIAMCINOLONE ACETONIDE 40 MG/ML IJ SUSP
20.0000 mg | Freq: Once | INTRAMUSCULAR | Status: AC
  Administered 2013-09-08: 20 mg

## 2013-09-08 NOTE — Progress Notes (Signed)
Chief Complaint  Patient presents with  . Plantar Fasciitis    "This right heel is still hurting." right heel     HPI: Patient is 43 y.o. female who presents today for continued pain in her right heel. She has had 2 injections one in November and one in June and she states that they helped significantly. She also has orthotics of which she states are helping as well. She relates the pain in the right foot used to be at 20 and now is down to about an 8.  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  Physical Exam  Patient is awake, alert, and oriented x 3.  In no acute distress.  Vascular status is intact with palpable pedal pulses at 2/4 DP and PT bilateral and capillary refill time within normal limits. Neurological sensation is also intact bilaterally via Semmes Weinstein monofilament at 5/5 sites. Light touch, vibratory sensation, Achilles tendon reflex is intact. Dermatological exam reveals skin color, turger and texture as normal. No open lesions present.  Musculature intact with dorsiflexion, plantarflexion, inversion, eversion.  Plantar fasciitis is noted on the right heel with inflammation easily palpated. Pain on palpation is noted on the plantar medial aspect of the right heel at the insertion of the plantar fascia medially.  Assessment: Plantar fasciitis right  Plan: Injected the right heel for injection #3 with Kenalog and Marcaine mixture. She tolerated this well. Discussed that if there is no improvement in 2 weeks she will call back and I will refer her to [facility] for said physical therapy. She also was curious about the dryness to her feet and I recommended sleep n heel sleeves.

## 2013-09-08 NOTE — Patient Instructions (Signed)
Call if it isn't improved in 2 weeks and I will send over a referral for physical therapy with John O'halloran (he has a websEllamae Siaite if you want to check him out)  Also for the dryness of the skin-- try Sleep N Heel .Marland Kitchen. They are heel sleeves and you can find them or similar sock things at [email or web address]  Plantar Fasciitis (Heel Spur Syndrome) with Rehab The plantar fascia is a fibrous, ligament-like, soft-tissue structure that spans the bottom of the foot. Plantar fasciitis is a condition that causes pain in the foot due to inflammation of the tissue. SYMPTOMS   Pain and tenderness on the underneath side of the foot.  Pain that worsens with standing or walking. CAUSES  Plantar fasciitis is caused by irritation and injury to the plantar fascia on the underneath side of the foot. Common mechanisms of injury include:  Direct trauma to bottom of the foot.  Damage to a small nerve that runs under the foot where the main fascia attaches to the heel bone. Stress placed on the plantar fascia due to any mild increased activity or injury RISK INCREASES WITH:   Obesity.  Poor strength and flexibility.  Improperly fitted shoes.  Tight calf muscles.  Flat feet.  Failure to warm-up properly before activity.  PREVENTION  Warm up and stretch properly before activity.  Strength, flexibility  Maintain a health body weight.  Avoid stress on the plantar fascia.  Wear properly fitted shoes, including arch supports for individuals who have flat feet. PROGNOSIS  If treated properly, then the symptoms of plantar fasciitis usually resolve without surgery. However, occasionally surgery is necessary. RELATED COMPLICATIONS   Recurrent symptoms that may result in a chronic condition.  Problems of the lower back that are caused by compensating for the injury, such as limping.  Pain or weakness of the foot during push-off following surgery.  Chronic inflammation, scarring, and partial or complete  fascia tear, occurring more often from repeated injections. TREATMENT  Treatment initially involves the use of ice and medication to help reduce pain and inflammation. The use of strengthening and stretching exercises may help reduce pain with activity, especially stretches of the Achilles tendon.  Your caregiver may recommend that you use arch supports to help reduce stress on the plantar fascia. Often, corticosteroid injections are given to reduce inflammation. If symptoms persist for greater than 6 months despite non-surgical (conservative), then surgery may be recommended.  MEDICATION   If pain medication is necessary, then nonsteroidal anti-inflammatory medications, such as aspirin and ibuprofen, or other minor pain relievers, such as acetaminophen, are often recommended. Corticosteroid injections may be given by your caregiver.  HEAT AND COLD  Cold treatment (icing) relieves pain and reduces inflammation. Cold treatment should be applied for 10 to 15 minutes every 2 to 3 hours for inflammation and pain and immediately after any activity that aggravates your symptoms. Use ice packs or massage the area with a piece of ice (ice massage).  Heat treatment may be used prior to performing the stretching and strengthening activities prescribed by your caregiver, physical therapist, or athletic trainer. Use a heat pack or soak the injury in warm water. SEEK IMMEDIATE MEDICAL CARE IF:  Treatment seems to offer no benefit, or the condition worsens.  Any medications produce adverse side effects.    EXERCISES-- perform each exercise a total of 10-15 repetitions.  Hold for 30 seconds and perform 3 times per day   RANGE OF MOTION (ROM) AND STRETCHING EXERCISES - Plantar Fasciitis (Heel  Spur Syndrome) These exercises may help you when beginning to rehabilitate your injury.   While completing these exercises, remember:   Restoring tissue flexibility helps normal motion to return to the joints. This  allows healthier, less painful movement and activity.  An effective stretch should be held for at least 30 seconds.  A stretch should never be painful. You should only feel a gentle lengthening or release in the stretched tissue. RANGE OF MOTION - Toe Extension, Flexion  Sit with your right / left leg crossed over your opposite knee.  Grasp your toes and gently pull them back toward the top of your foot. You should feel a stretch on the bottom of your toes and/or foot.  Hold this stretch for __________ seconds.  Now, gently pull your toes toward the bottom of your foot. You should feel a stretch on the top of your toes and or foot.  Hold this stretch for __________ seconds. Repeat __________ times. Complete this stretch __________ times per day.  RANGE OF MOTION - Ankle Dorsiflexion, Active Assisted  Remove shoes and sit on a chair that is preferably not on a carpeted surface.  Place right / left foot under knee. Extend your opposite leg for support.  Keeping your heel down, slide your right / left foot back toward the chair until you feel a stretch at your ankle or calf. If you do not feel a stretch, slide your bottom forward to the edge of the chair, while still keeping your heel down.  Hold this stretch for __________ seconds. Repeat __________ times. Complete this stretch __________ times per day.  STRETCH  Gastroc, Standing  Place hands on wall.  Extend right / left leg, keeping the front knee somewhat bent.  Slightly point your toes inward on your back foot.  Keeping your right / left heel on the floor and your knee straight, shift your weight toward the wall, not allowing your back to arch.  You should feel a gentle stretch in the right / left calf. Hold this position for __________ seconds. Repeat __________ times. Complete this stretch __________ times per day. STRETCH  Soleus, Standing  Place hands on wall.  Extend right / left leg, keeping the other knee somewhat  bent.  Slightly point your toes inward on your back foot.  Keep your right / left heel on the floor, bend your back knee, and slightly shift your weight over the back leg so that you feel a gentle stretch deep in your back calf.  Hold this position for __________ seconds. Repeat __________ times. Complete this stretch __________ times per day. STRETCH  Gastrocsoleus, Standing  Note: This exercise can place a lot of stress on your foot and ankle. Please complete this exercise only if specifically instructed by your caregiver.   Place the ball of your right / left foot on a step, keeping your other foot firmly on the same step.  Hold on to the wall or a rail for balance.  Slowly lift your other foot, allowing your body weight to press your heel down over the edge of the step.  You should feel a stretch in your right / left calf.  Hold this position for __________ seconds.  Repeat this exercise with a slight bend in your right / left knee. Repeat __________ times. Complete this stretch __________ times per day.  STRENGTHENING EXERCISES - Plantar Fasciitis (Heel Spur Syndrome)  These exercises may help you when beginning to rehabilitate your injury. They may resolve your  symptoms with or without further involvement from your physician, physical therapist or athletic trainer. While completing these exercises, remember:   Muscles can gain both the endurance and the strength needed for everyday activities through controlled exercises.  Complete these exercises as instructed by your physician, physical therapist or athletic trainer. Progress the resistance and repetitions only as guided.

## 2013-12-11 ENCOUNTER — Encounter: Payer: Self-pay | Admitting: Podiatrist

## 2014-04-30 ENCOUNTER — Other Ambulatory Visit (HOSPITAL_COMMUNITY): Payer: Self-pay | Admitting: Obstetrics and Gynecology

## 2014-04-30 DIAGNOSIS — Z1231 Encounter for screening mammogram for malignant neoplasm of breast: Secondary | ICD-10-CM

## 2014-05-03 ENCOUNTER — Ambulatory Visit (HOSPITAL_COMMUNITY): Payer: Self-pay | Attending: Obstetrics and Gynecology

## 2014-05-15 ENCOUNTER — Ambulatory Visit: Payer: Self-pay | Admitting: Internal Medicine

## 2014-05-24 ENCOUNTER — Telehealth: Payer: Self-pay | Admitting: Internal Medicine

## 2014-05-24 ENCOUNTER — Ambulatory Visit (INDEPENDENT_AMBULATORY_CARE_PROVIDER_SITE_OTHER): Payer: BLUE CROSS/BLUE SHIELD | Admitting: Internal Medicine

## 2014-05-24 ENCOUNTER — Encounter: Payer: Self-pay | Admitting: Internal Medicine

## 2014-05-24 VITALS — BP 131/81 | HR 88 | Resp 16 | Ht 67.0 in | Wt 193.0 lb

## 2014-05-24 DIAGNOSIS — E8940 Asymptomatic postprocedural ovarian failure: Secondary | ICD-10-CM

## 2014-05-24 DIAGNOSIS — J301 Allergic rhinitis due to pollen: Secondary | ICD-10-CM

## 2014-05-24 DIAGNOSIS — N958 Other specified menopausal and perimenopausal disorders: Secondary | ICD-10-CM | POA: Diagnosis not present

## 2014-05-24 DIAGNOSIS — L218 Other seborrheic dermatitis: Secondary | ICD-10-CM | POA: Diagnosis not present

## 2014-05-24 DIAGNOSIS — L219 Seborrheic dermatitis, unspecified: Secondary | ICD-10-CM

## 2014-05-24 DIAGNOSIS — J309 Allergic rhinitis, unspecified: Secondary | ICD-10-CM | POA: Insufficient documentation

## 2014-05-24 MED ORDER — FLUOCINOLONE ACETONIDE 0.01 % EX CREA: TOPICAL_CREAM | Freq: Two times a day (BID) | CUTANEOUS | Status: DC

## 2014-05-24 MED ORDER — KETOCONAZOLE 2 % EX SHAM: 1.0000 "application " | MEDICATED_SHAMPOO | CUTANEOUS | Status: DC

## 2014-05-24 NOTE — Telephone Encounter (Signed)
Call pt and inquire if she has had a mm within last one year time frame    I need to make sure this is OK before I can re-order her estrogen

## 2014-05-24 NOTE — Progress Notes (Signed)
Subjective:    Patient ID: Melissa Evans, female    DOB: 12/24/1970, 44 y.o.   MRN: [id number]  HPI 04/14/2012 note  Melissa Evans is here for follow up .  I have not seen her in over 2 years.  She had been caring for her sick mother who died of lung CA back in the fall  Overall doing well   Using Minivelle with no hot flushes since bilateral oopherectomy   Seb Derm of scalp flared up a few months ago  Problems with allergies Sneezing and eyes itching  No Known Allergies Past Medical History  Diagnosis Date  . Allergy   . LPRD (laryngopharyngeal reflux disease)   . PMS (premenstrual syndrome)    Past Surgical History  Procedure Laterality Date  . Left oophorectomy    . Wisdom tooth extraction    . Abdominal hysterectomy     History   Social History  . Marital Status: Married    Spouse Name: N/A  . Number of Children: N/A  . Years of Education: N/A   Occupational History  . Not on file.   Social History Main Topics  . Smoking status: Never Smoker   . Smokeless tobacco: Never Used  . Alcohol Use: Yes  . Drug Use: No  . Sexual Activity: Yes   Other Topics Concern  . Not on file   Social History Narrative   Family History  Problem Relation Age of Onset  . Lung cancer Mother   . Fibroids Sister   . Parkinsonism Maternal Grandmother   . Diabetes Maternal Grandmother   . Lung cancer Maternal Grandfather    Patient Active Problem List   Diagnosis Date Noted  . Unspecified vitamin D deficiency 04/20/2012  . Anemia 04/17/2012  . Surgical menopause 04/14/2012  . Ovarian mass 04/14/2012  . GERD (gastroesophageal reflux disease) 12/20/2011  . Seborrheic dermatitis of scalp 07/21/2011  . Pelvic mass in female 07/02/2011  . Family history of lung cancer 05/07/2011  . Allergy   . LPRD (laryngopharyngeal reflux disease)   . PMS (premenstrual syndrome)    Current Outpatient Prescriptions on File Prior to Visit  Medication Sig Dispense Refill  . acetaminophen  (TYLENOL) 500 MG tablet Take 1,000 mg by mouth every 6 (six) hours as needed. pain    . azithromycin (ZITHROMAX) 250 MG tablet Take as directed 6 tablet 0  . cetirizine (ZYRTEC) 10 MG tablet Take 10 mg by mouth daily as needed. allergies    . Cholecalciferol (VITAMIN D3) 50000 UNITS CAPS Take 1 capsule by mouth once a week. 8 capsule 0  . hydrocortisone 2.5 % cream Apply topically 2 (two) times daily. Apply to neck rash bid 30 g 1  . Ibuprofen (ADVIL) 200 MG CAPS Take 1,000 mg by mouth 2 (two) times daily.    . meloxicam (MOBIC) 15 MG tablet Take 1 tablet (15 mg total) by mouth daily. 30 tablet 2  . MINIVELLE 0.1 MG/24HR patch PLACE 1 PATCH (0.1 MG TOTAL) ONTO THE SKIN 2 (TWO) TIMES A WEEK. 8 patch 0  . MINIVELLE 0.1 MG/24HR patch PLACE 1 PATCH (0.1 MG TOTAL) ONTO THE SKIN 2 (TWO) TIMES A WEEK. 24 patch 1  . scopolamine (TRANSDERM-SCOP) 1 MG/3DAYS Place one patch behind ear one time for motion sickness 1 patch 0  . triamcinolone cream (KENALOG) 0.1 %      No current facility-administered medications on file prior to visit.        Review of Systems See HPI  Objective:   Physical Exam Physical Exam  Nursing note and vitals reviewed.  Constitutional: She is oriented to person, place, and time. She appears well-developed and well-nourished.  HENT:  Head: Normocephalic and atraumatic.  Cardiovascular: Normal rate and regular rhythm. Exam reveals no gallop and no friction rub.  No murmur heard.  Pulmonary/Chest: Breath sounds normal. She has no wheezes. She has no rales.  Neurological: She is alert and oriented to person, place, and time.  Skin: Skin is warm and dry.  Psychiatric: She has a normal mood and affect. Her behavior is normal.       Assessment & Plan:  Surgical menopause continue Minivelle  Ok to refill when pt has her mm  Seb derm of scalp   Continue meds   Allergic rhinitis   OTC  Antihistamine of choice,  Flonase daily for 4 weeks   Pt informed of my departure  and given letter with alternative PCP.  Advised to make appt now

## 2014-05-28 ENCOUNTER — Other Ambulatory Visit: Payer: Self-pay | Admitting: Internal Medicine

## 2014-05-28 ENCOUNTER — Encounter: Payer: Self-pay | Admitting: *Deleted

## 2014-05-28 ENCOUNTER — Ambulatory Visit (HOSPITAL_COMMUNITY)
Admission: RE | Admit: 2014-05-28 | Discharge: 2014-05-28 | Disposition: A | Discharge status: Home / Self Care | Payer: BLUE CROSS/BLUE SHIELD | Source: Ambulatory Visit | Attending: Obstetrics and Gynecology | Admitting: Obstetrics and Gynecology

## 2014-05-28 DIAGNOSIS — Z1231 Encounter for screening mammogram for malignant neoplasm of breast: Secondary | ICD-10-CM | POA: Diagnosis not present

## 2014-05-29 MED ORDER — ESTRADIOL 0.1 MG/24HR TD PTTW: MEDICATED_PATCH | TRANSDERMAL | Status: DC

## 2014-05-29 NOTE — Telephone Encounter (Signed)
Yes she did have the mammogram done and  the results are in Epic now

## 2014-06-12 ENCOUNTER — Ambulatory Visit: Payer: BLUE CROSS/BLUE SHIELD

## 2015-05-02 ENCOUNTER — Ambulatory Visit: Payer: BLUE CROSS/BLUE SHIELD | Admitting: Speech Pathology

## 2015-05-14 ENCOUNTER — Ambulatory Visit: Payer: BLUE CROSS/BLUE SHIELD | Admitting: Family Medicine

## 2015-10-02 ENCOUNTER — Other Ambulatory Visit: Payer: Self-pay | Admitting: Obstetrics and Gynecology

## 2015-10-02 DIAGNOSIS — Z1231 Encounter for screening mammogram for malignant neoplasm of breast: Secondary | ICD-10-CM

## 2015-10-10 ENCOUNTER — Ambulatory Visit: Payer: BLUE CROSS/BLUE SHIELD

## 2015-10-24 ENCOUNTER — Ambulatory Visit: Payer: BLUE CROSS/BLUE SHIELD

## 2015-11-04 ENCOUNTER — Ambulatory Visit
Admission: RE | Admit: 2015-11-04 | Discharge: 2015-11-04 | Disposition: A | Discharge status: Home / Self Care | Payer: BLUE CROSS/BLUE SHIELD | Source: Ambulatory Visit | Attending: Obstetrics and Gynecology | Admitting: Obstetrics and Gynecology

## 2015-11-04 DIAGNOSIS — Z1231 Encounter for screening mammogram for malignant neoplasm of breast: Secondary | ICD-10-CM

## 2017-01-26 ENCOUNTER — Other Ambulatory Visit: Payer: Self-pay | Admitting: Obstetrics and Gynecology

## 2017-01-26 DIAGNOSIS — Z1231 Encounter for screening mammogram for malignant neoplasm of breast: Secondary | ICD-10-CM

## 2017-01-29 ENCOUNTER — Ambulatory Visit
Admission: RE | Admit: 2017-01-29 | Discharge: 2017-01-29 | Disposition: A | Discharge status: Home / Self Care | Payer: Managed Care, Other (non HMO) | Source: Ambulatory Visit | Attending: Obstetrics and Gynecology | Admitting: Obstetrics and Gynecology

## 2017-01-29 DIAGNOSIS — Z1231 Encounter for screening mammogram for malignant neoplasm of breast: Secondary | ICD-10-CM

## 2017-02-25 ENCOUNTER — Ambulatory Visit: Payer: BLUE CROSS/BLUE SHIELD

## 2017-06-07 ENCOUNTER — Ambulatory Visit: Payer: Self-pay | Admitting: Nurse Practitioner

## 2017-06-07 VITALS — BP 124/90 | HR 69 | Temp 98.3°F | Resp 18 | Wt 209.4 lb

## 2017-06-07 DIAGNOSIS — K120 Recurrent oral aphthae: Secondary | ICD-10-CM

## 2017-06-07 MED ORDER — MAGIC MOUTHWASH W/LIDOCAINE: 5.0000 mL | Freq: Three times a day (TID) | ORAL | 0 refills | Status: AC | PRN

## 2017-06-07 NOTE — Progress Notes (Signed)
Subjective:     Melissa Evans is a 47 y.o. female who presents for evaluation of sore throat. Associated symptoms include sore throat. Onset of symptoms was 5 days ago, and have been unchanged since that time. She is drinking plenty of fluids. She has not had a recent close exposure to someone with proven streptococcal pharyngitis.  Patent states she has a history of tonsil stones, which she can remove on her own normally.   The following portions of the patient's history were reviewed and updated as appropriate: allergies, current medications and past medical history.  Review of Systems Constitutional: negative Eyes: negative Ears, nose, mouth, throat, and face: positive for sore throat, negative for ear drainage, earaches, hoarseness and nasal congestion Respiratory: negative Cardiovascular: negative Gastrointestinal: negative Allergic/Immunologic: positive for hay fever    Objective:    BP 124/90 (BP Location: Right Arm, Patient Position: Sitting, Cuff Size: Normal)   Pulse 69   Temp 98.3 F (36.8 C) (Oral)   Resp 18   Wt 209 lb 6.4 oz (95 kg)   LMP 06/16/2011   SpO2 97%   BMI 32.80 kg/m  General appearance: alert, cooperative and no distress Head: Normocephalic, without obvious abnormality, atraumatic Eyes: conjunctivae/corneas clear. PERRL, EOM's intact. Fundi benign. Ears: normal TM's and external ear canals both ears Nose: Nares normal. Septum midline. Mucosa normal. No drainage or sinus tenderness. Throat: normal findings: lips normal without lesions, buccal mucosa normal and soft palate, uvula, and tonsils normal and abnormal findings: aphthous ulceration and right throat Lungs: clear to auscultation bilaterally Heart: regular rate and rhythm, S1, S2 normal, no murmur, click, rub or gallop Abdomen: soft, non-tender; bowel sounds normal; no masses,  no organomegaly Pulses: 2+ and symmetric Skin: Skin color, texture, turgor normal. No rashes or lesions Lymph nodes:  cervical and submandibular nodes normal  Laboratory Strep test not done. Results:not applicable.    Assessment:   Aphthous Stomatitis   Plan:  1.  Magic mouthwash 4 times daily until symptoms improve. 2.  Ibuprofen 800 mg 3 times daily for 3 days. 3.  Avoid spicy and/or acidic foods until symptoms improve. 4.  Patient education provided. 5.  Follow-up as needed. 6.  Patient verbalizes understanding and has no questions at time of discharge. Meds ordered this encounter  Medications  . magic mouthwash w/lidocaine SOLN    Sig: Take 5 mLs by mouth 3 (three) times daily as needed for up to 7 days for mouth pain. Take 10mL by mouth to swish and swallow qid for sore throat    Dispense:  240 mL    Refill:  0    Doxycycline  in 40ml DW; Benadryl 12.5mg /38ml ; Nystatin 30mL; Prednisone  in 50mL DW    Order Specific Question:   Supervising Provider    Answer:   Stacie Glaze [phone]

## 2017-06-07 NOTE — Patient Instructions (Signed)
Stomatitis Stomatitis is a condition that causes inflammation in your mouth. It can affect a part of your mouth or your whole mouth. The condition often affects your cheek, teeth, gums, lips, and tongue. Stomatitis can also affect the mucous membranes that surround your mouth (mucosa). Pain from stomatitis can make it hard for you to eat or drink. Severe cases of this condition can lead to dehydration or poor nutrition. What are the causes? Common causes of this condition include:  Viruses, such as cold sores or oral herpes and shingles.  Canker sores.  Bacterial infections.  Fungus or yeast infections, such as oral thrush.  Not getting adequate nutrition.  Injury to your mouth. This can be from: ? Dentures or braces that do not fit well. ? Biting your tongue or cheek. ? Burning your mouth. ? Having sharp or broken teeth.  Gum disease.  Using tobacco, especially chewing tobacco.  Allergies to foods, medicines, or substances that are used in your mouth.  Medicines, including cancer medicines (chemotherapy), antihistamines, and seizure medicines.  In some cases, the cause may not be known. What increases the risk? This condition is more likely to develop in people who:  Have poor oral hygiene or poor nutrition.  Have any condition that causes a dry mouth.  Are under a lot of physical or emotional stress.  Have any condition that weakens the body's defense system (immune system).  Are being treated for cancer.  Smoke.  What are the signs or symptoms? The most common symptoms of this condition are pain, swelling, and redness inside your mouth. The pain may feel like burning or stinging. It may get worse from eating or drinking. Other symptoms include:  Painful, shallow sores (ulcers) in the mouth.  Blisters in the mouth.  Bleeding gums.  Swollen gums.  Irritability and fatigue.  Bad breath.  Bad taste in the mouth.  Fever.  How is this diagnosed? This  condition is diagnosed with a physical exam to check for bleeding gums and mouth ulcers. You may also have other tests, including:  Blood tests to look for infection or vitamin deficiencies.  Mouth swab to get a fluid sample to test for bacteria (culture).  Tissue sample from an ulcer to examine under a microscope (biopsy).  How is this treated? Treatment for stomatitis depends on the cause. Treatment may include medicines, such as:  Over-the counter (OTC) pain medicines.  Topical anesthetic to numb the area if you have severe pain.  Antibiotics to treat a bacterial infection.  Antifungals to treat a fungal infection.  Antivirals to treat a viral infection.  Mouth rinses that contain steroids to reduce the swelling in your mouth.  Other medicines to coat or numb your mouth.  Follow these instructions at home: Medicines  Take medicines only as directed by your health care provider.  If you were prescribed an antibiotic, finish all of it even if you start to feel better. Lifestyle  Practice good oral hygiene: ? Gently brush your teeth with a soft, nylon-bristled toothbrush two times each day. ? Floss your teeth every day. ? Have your teeth cleaned regularly, as recommended by your dentist.  Eat a balanced diet.Do not eat: ? Spicy foods. ? Citrus, such as oranges. ? Foods that have sharp edges, such as chips.  Avoid any foods or other allergens that you think may be causing your stomatitis.  If you have dentures, make sure that they are properly fitted.  Do not use any tobacco products, including cigarettes,   chewing tobacco, or electronic cigarettes. If you need help quitting, ask your health care provider.  Find ways to reduce stress. Try yoga or meditation. Ask your health care provider for other ideas. General instructions  Use a salt-water rinse for pain as directed by your health care provider. Mix 1 tsp of salt in 2 cups of water.  Drink enough fluid to keep  your urine clear or pale yellow. This will keep you hydrated. Contact a health care provider if:  Your symptoms get worse.  You develop new symptoms, especially: ? A rash. ? New symptoms that do not involve your mouth area.  Your symptoms last longer than three weeks.  Your stomatitis goes away and then returns.  You have a harder time eating and drinking normally.  You have increasing fatigue or weakness.  You lose your appetite or you feel nauseous.  You have a fever. This information is not intended to replace advice given to you by your health care provider. Make sure you discuss any questions you have with your health care provider. Document Released: 11/23/2006 Document Revised: 09/25/2015 Document Reviewed: 01/22/2014 Elsevier Interactive Patient Education  2018 [employer]  

## 2017-06-10 ENCOUNTER — Telehealth: Payer: Self-pay

## 2017-06-10 NOTE — Telephone Encounter (Signed)
Patient states she is using the treatment and she still have pain, but she is waiting for the treatment be done and feel better.

## 2018-01-11 ENCOUNTER — Ambulatory Visit (INDEPENDENT_AMBULATORY_CARE_PROVIDER_SITE_OTHER): Payer: No Typology Code available for payment source | Admitting: Family Medicine

## 2018-01-11 ENCOUNTER — Encounter: Payer: Self-pay | Admitting: Family Medicine

## 2018-01-11 ENCOUNTER — Ambulatory Visit (INDEPENDENT_AMBULATORY_CARE_PROVIDER_SITE_OTHER): Payer: No Typology Code available for payment source

## 2018-01-11 VITALS — BP 120/90 | HR 69 | Temp 97.7°F | Ht 67.0 in | Wt 211.6 lb

## 2018-01-11 DIAGNOSIS — M79641 Pain in right hand: Secondary | ICD-10-CM | POA: Diagnosis not present

## 2018-01-11 DIAGNOSIS — M79644 Pain in right finger(s): Secondary | ICD-10-CM

## 2018-01-11 MED ORDER — DICLOFENAC SODIUM 2 % TD SOLN: 1.0000 "application " | Freq: Two times a day (BID) | TRANSDERMAL | 3 refills | Status: DC

## 2018-01-11 NOTE — Patient Instructions (Signed)
Nice to meet you  Please try the pennsaid on the area  Please try to avoid contact with the area that is painful  Please see me back in 3-4 weeks if no better.

## 2018-01-11 NOTE — Assessment & Plan Note (Signed)
Has a numbness feeling in the index finger along the median nerve distribution. Unclear if she has irritated the local nerve or more of a carpal tunnel problem  - pennsaid and sample provided  - provided cock up wrist splint  - if no improvement consider imaging, EMG

## 2018-01-11 NOTE — Progress Notes (Signed)
Melissa Evans - 47 y.o. female MRN [id number]  Date of birth: 01/25/1971  SUBJECTIVE:  Including CC & ROS.  Chief Complaint  Patient presents with  . Hand Pain    right hand / index finger    Melissa Evans is a 47 y.o. female that is presenting with right finger pain of the index finger and numbness.  The symptoms started a couple of days ago.  She feels it occurring on the radial aspect of the MCP joint of the second digit with radiation down the radial aspect of the index finger.  She has a sensation when she holds objects in her hand.  The symptoms are intermittent.  Symptoms are mild to moderate.  Denies any history of similar symptoms.  Has not taken anything for the pain.  Has tried a brace.   Review of Systems  Constitutional: Negative for fever.  HENT: Negative for congestion.   Respiratory: Negative for cough.   Cardiovascular: Negative for chest pain.  Gastrointestinal: Negative for abdominal pain.  Musculoskeletal: Negative for joint swelling.  Skin: Negative for color change.  Neurological: Positive for numbness.  Hematological: Negative for adenopathy.  Psychiatric/Behavioral: Negative for agitation.    HISTORY: Past Medical, Surgical, Social, and Family History Reviewed & Updated per EMR.   Pertinent Historical Findings include:  Past Medical History:  Diagnosis Date  . Allergy   . LPRD (laryngopharyngeal reflux disease)   . PMS (premenstrual syndrome)     Past Surgical History:  Procedure Laterality Date  . ABDOMINAL HYSTERECTOMY    . LEFT OOPHORECTOMY    . WISDOM TOOTH EXTRACTION      No Known Allergies  Family History  Problem Relation Age of Onset  . Lung cancer Mother   . Lung cancer Maternal Grandfather   . Fibroids Sister   . Parkinsonism Maternal Grandmother   . Diabetes Maternal Grandmother      Social History   Socioeconomic History  . Marital status: Married    Spouse name: Not on file  . Number of children: Not on file  .  Years of education: Not on file  . Highest education level: Not on file  Occupational History  . Not on file  Social Needs  . Financial resource strain: Not on file  . Food insecurity:    Worry: Not on file    Inability: Not on file  . Transportation needs:    Medical: Not on file    Non-medical: Not on file  Tobacco Use  . Smoking status: Never Smoker  . Smokeless tobacco: Never Used  Substance and Sexual Activity  . Alcohol use: Yes  . Drug use: No  . Sexual activity: Yes  Lifestyle  . Physical activity:    Days per week: Not on file    Minutes per session: Not on file  . Stress: Not on file  Relationships  . Social connections:    Talks on phone: Not on file    Gets together: Not on file    Attends religious service: Not on file    Active member of club or organization: Not on file    Attends meetings of clubs or organizations: Not on file    Relationship status: Not on file  . Intimate partner violence:    Fear of current or ex partner: Not on file    Emotionally abused: Not on file    Physically abused: Not on file    Forced sexual activity: Not on file  Other  Topics Concern  . Not on file  Social History Narrative  . Not on file     PHYSICAL EXAM:  VS: BP 120/90 (BP Location: Left Arm, Patient Position: Sitting, Cuff Size: Normal)   Pulse 69   Temp 97.7 F (36.5 C) (Oral)   Ht 5\' 7"  (1.702 m)   Wt 211 lb 9.6 oz (96 kg)   LMP 06/16/2011   SpO2 99%   BMI 33.14 kg/m  Physical Exam Gen: NAD, alert, cooperative with exam, well-appearing ENT: normal lips, normal nasal mucosa,  Eye: normal EOM, normal conjunctiva and lids CV:  no edema, +2 pedal pulses   Resp: no accessory muscle use, non-labored,  Skin: no rashes, no areas of induration  Neuro: normal tone, normal sensation to touch Psych:  normal insight, alert and oriented MSK:  Right hand: No swelling or ecchymosis. Some tenderness to palpation of the radial second MCP joint. Normal finger  strength resistance with abduction and abduction. Normal pincer grasp. Normal thumb resistance to extension. Neurovascular intact  Limited ultrasound: right index finger:  Normal appearing MCP joint in static and dynamic testing  Normal PIP and DIP joint  Normal appearing flexor and extensor tendon   Summary: normal exam  Ultrasound and interpretation by Clare GandyJeremy Schmitz, MD    ASSESSMENT & PLAN:   Finger pain, right Has a numbness feeling in the index finger along the median nerve distribution. Unclear if she has irritated the local nerve or more of a carpal tunnel problem  - pennsaid and sample provided  - provided cock up wrist splint  - if no improvement consider imaging, EMG

## 2018-01-14 ENCOUNTER — Ambulatory Visit: Payer: Self-pay | Admitting: Family Medicine

## 2018-01-20 ENCOUNTER — Ambulatory Visit
Admission: RE | Admit: 2018-01-20 | Discharge: 2018-01-20 | Disposition: A | Discharge status: Home / Self Care | Payer: No Typology Code available for payment source | Source: Ambulatory Visit | Attending: Internal Medicine | Admitting: Internal Medicine

## 2018-01-20 ENCOUNTER — Other Ambulatory Visit: Payer: Self-pay | Admitting: Internal Medicine

## 2018-01-20 DIAGNOSIS — Z1231 Encounter for screening mammogram for malignant neoplasm of breast: Secondary | ICD-10-CM

## 2018-02-16 ENCOUNTER — Ambulatory Visit: Payer: No Typology Code available for payment source | Admitting: Family Medicine

## 2018-11-03 MED FILL — KETOCONAZOLE 2% SHAMPOO: 2 | 30 days supply | Qty: 120 | Fill #0

## 2018-11-03 MED FILL — FLUOCINOLONE ACETONIDE SCAL: 0.01 | 30 days supply | Qty: 118 | Fill #0

## 2018-11-18 ENCOUNTER — Encounter (INDEPENDENT_AMBULATORY_CARE_PROVIDER_SITE_OTHER): Payer: Self-pay | Admitting: Ophthalmology

## 2018-12-08 ENCOUNTER — Encounter (INDEPENDENT_AMBULATORY_CARE_PROVIDER_SITE_OTHER): Payer: Self-pay | Admitting: Ophthalmology

## 2018-12-20 ENCOUNTER — Encounter (INDEPENDENT_AMBULATORY_CARE_PROVIDER_SITE_OTHER): Payer: No Typology Code available for payment source | Admitting: Ophthalmology

## 2018-12-23 MED FILL — FLUOCINOLONE ACETONIDE SCAL: 0.01 | 30 days supply | Qty: 118 | Fill #0

## 2018-12-23 MED FILL — KETOCONAZOLE 2% SHAMPOO: 2 | 30 days supply | Qty: 120 | Fill #0

## 2018-12-27 ENCOUNTER — Encounter (INDEPENDENT_AMBULATORY_CARE_PROVIDER_SITE_OTHER): Payer: No Typology Code available for payment source | Admitting: Ophthalmology

## 2018-12-27 DIAGNOSIS — H35412 Lattice degeneration of retina, left eye: Secondary | ICD-10-CM

## 2018-12-27 DIAGNOSIS — H2513 Age-related nuclear cataract, bilateral: Secondary | ICD-10-CM

## 2018-12-27 DIAGNOSIS — H43813 Vitreous degeneration, bilateral: Secondary | ICD-10-CM

## 2018-12-27 DIAGNOSIS — H31003 Unspecified chorioretinal scars, bilateral: Secondary | ICD-10-CM

## 2019-02-14 ENCOUNTER — Other Ambulatory Visit: Payer: Self-pay | Admitting: Internal Medicine

## 2019-02-14 DIAGNOSIS — Z1231 Encounter for screening mammogram for malignant neoplasm of breast: Secondary | ICD-10-CM

## 2019-03-28 ENCOUNTER — Ambulatory Visit
Admission: RE | Admit: 2019-03-28 | Discharge: 2019-03-28 | Disposition: A | Discharge status: Home / Self Care | Payer: No Typology Code available for payment source | Source: Ambulatory Visit | Attending: Internal Medicine | Admitting: Internal Medicine

## 2019-03-28 ENCOUNTER — Other Ambulatory Visit: Payer: Self-pay

## 2019-03-28 DIAGNOSIS — Z1231 Encounter for screening mammogram for malignant neoplasm of breast: Secondary | ICD-10-CM

## 2019-06-03 MED FILL — KETOCONAZOLE 2% SHAMPOO: 2 | 30 days supply | Qty: 120 | Fill #0

## 2019-07-18 ENCOUNTER — Other Ambulatory Visit (HOSPITAL_COMMUNITY): Payer: Self-pay | Admitting: Internal Medicine

## 2019-07-28 MED FILL — VIT D2 1.25 MG (50,000 UNIT: 1.25 MG | 28 days supply | Qty: 4 | Fill #0

## 2019-07-28 MED FILL — KETOCONAZOLE 2% SHAMPOO: 2 | 30 days supply | Qty: 120 | Fill #0

## 2019-07-28 MED FILL — FLUOCINOLONE ACETONIDE BODY: 0.01 | 30 days supply | Qty: 118 | Fill #0

## 2019-07-28 MED FILL — MOMETASONE FUROATE 0.1% CRM: 0.1 | 7 days supply | Qty: 15 | Fill #0

## 2019-10-13 MED FILL — KETOCONAZOLE 2% SHAMPOO: 2 | 30 days supply | Qty: 120 | Fill #1

## 2019-10-13 MED FILL — FLUOCINOLONE ACETONIDE SCAL: 0.01 | 30 days supply | Qty: 118 | Fill #1

## 2019-12-27 ENCOUNTER — Encounter (INDEPENDENT_AMBULATORY_CARE_PROVIDER_SITE_OTHER): Payer: No Typology Code available for payment source | Admitting: Ophthalmology

## 2020-01-12 MED FILL — KETOCONAZOLE 2% SHAMPOO: 2 | 30 days supply | Qty: 120 | Fill #2

## 2020-01-12 MED FILL — FLUOCINOLONE ACETONIDE BODY: 0.01 | 30 days supply | Qty: 118 | Fill #0

## 2020-01-25 ENCOUNTER — Encounter: Payer: Self-pay | Admitting: Gastroenterology

## 2020-02-08 MED FILL — KETOCONAZOLE 2% SHAMPOO: 2 | 30 days supply | Qty: 120 | Fill #3

## 2020-02-08 MED FILL — FLUOCINOLONE ACETONIDE BODY: 0.01 | 30 days supply | Qty: 118 | Fill #0

## 2020-02-19 ENCOUNTER — Other Ambulatory Visit (HOSPITAL_COMMUNITY): Payer: Self-pay | Admitting: Gastroenterology

## 2020-02-19 ENCOUNTER — Ambulatory Visit (AMBULATORY_SURGERY_CENTER): Payer: No Typology Code available for payment source

## 2020-02-19 VITALS — Ht 67.0 in | Wt 208.0 lb

## 2020-02-19 DIAGNOSIS — Z1211 Encounter for screening for malignant neoplasm of colon: Secondary | ICD-10-CM

## 2020-02-19 MED ORDER — NA SULFATE-K SULFATE-MG SULF 17.5-3.13-1.6 GM/177ML PO SOLN: 1.0000 | Freq: Once | ORAL | 0 refills | Status: AC

## 2020-02-19 MED FILL — SUPREP BOWEL PREP KIT: 17.5-3.13-1 | 1 days supply | Qty: 354 | Fill #0

## 2020-02-19 NOTE — Progress Notes (Signed)
No egg or soy allergy known to patient  No issues with past sedation with any surgeries or procedures No intubation problems in the past  No FH of Malignant Hyperthermia No diet pills per patient No home 02 use per patient  No blood thinners per patient  Pt denies issues with constipation  No A fib or A flutter  EMMI video to pt or via MyChart  COVID 19 guidelines implemented in [facility] today with Pt and RN  Pt is fully vaccinated  for Covid   VIRTUAL PREVISIT  Due to the COVID-19 pandemic we are asking patients to follow certain guidelines.  Pt aware of COVID protocols and [facility] guidelines   

## 2020-03-05 ENCOUNTER — Encounter: Payer: No Typology Code available for payment source | Admitting: Gastroenterology

## 2020-04-19 MED FILL — KETOCONAZOLE 2% SHAMPOO: 2 | 30 days supply | Qty: 120 | Fill #4

## 2020-04-19 MED FILL — FLUOCINOLONE ACETONIDE BODY: 0.01 | 30 days supply | Qty: 118 | Fill #1

## 2020-05-06 ENCOUNTER — Other Ambulatory Visit (HOSPITAL_COMMUNITY): Payer: Self-pay | Admitting: Gastroenterology

## 2020-05-06 MED FILL — SUPREP BOWEL PREP KIT: 17.5-3.13-1 | 1 days supply | Qty: 354 | Fill #0

## 2020-07-26 ENCOUNTER — Other Ambulatory Visit (HOSPITAL_COMMUNITY): Payer: Self-pay

## 2020-07-26 MED ORDER — CARESTART COVID-19 HOME TEST VI KIT
PACK | 0 refills | Status: DC
  Filled 2020-07-26: qty 4, 4d supply, fill #0

## 2020-08-01 ENCOUNTER — Other Ambulatory Visit (HOSPITAL_COMMUNITY): Payer: Self-pay

## 2020-08-02 ENCOUNTER — Other Ambulatory Visit (HOSPITAL_COMMUNITY): Payer: Self-pay

## 2020-08-02 MED ORDER — FLUOCINOLONE ACETONIDE BODY 0.01 % EX OIL
TOPICAL_OIL | CUTANEOUS | 3 refills | Status: AC
  Filled 2020-08-02: qty 118.28, 30d supply, fill #0
  Filled 2020-10-24: qty 118.28, 25d supply, fill #0
  Filled 2020-11-01: qty 118.28, 30d supply, fill #0

## 2020-08-05 ENCOUNTER — Other Ambulatory Visit (HOSPITAL_COMMUNITY): Payer: Self-pay

## 2020-08-14 ENCOUNTER — Other Ambulatory Visit (HOSPITAL_COMMUNITY): Payer: Self-pay

## 2020-08-16 ENCOUNTER — Other Ambulatory Visit: Payer: Self-pay | Admitting: Internal Medicine

## 2020-08-16 DIAGNOSIS — Z1231 Encounter for screening mammogram for malignant neoplasm of breast: Secondary | ICD-10-CM

## 2020-08-27 ENCOUNTER — Other Ambulatory Visit (HOSPITAL_COMMUNITY): Payer: Self-pay

## 2020-08-27 MED ORDER — VITAMIN D3 1.25 MG (50000 UT) PO CAPS
50000.0000 [IU] | ORAL_CAPSULE | ORAL | 0 refills | Status: DC
  Filled 2020-08-27 – 2020-11-01 (×3): qty 12, 84d supply, fill #0

## 2020-08-29 ENCOUNTER — Ambulatory Visit
Admission: RE | Admit: 2020-08-29 | Discharge: 2020-08-29 | Disposition: A | Discharge status: Home / Self Care | Payer: No Typology Code available for payment source | Source: Ambulatory Visit | Attending: Internal Medicine | Admitting: Internal Medicine

## 2020-08-29 ENCOUNTER — Other Ambulatory Visit: Payer: Self-pay

## 2020-08-29 DIAGNOSIS — Z1231 Encounter for screening mammogram for malignant neoplasm of breast: Secondary | ICD-10-CM

## 2020-08-30 ENCOUNTER — Ambulatory Visit: Payer: No Typology Code available for payment source

## 2020-09-04 ENCOUNTER — Other Ambulatory Visit (HOSPITAL_COMMUNITY): Payer: Self-pay

## 2020-10-24 ENCOUNTER — Other Ambulatory Visit (HOSPITAL_COMMUNITY): Payer: Self-pay

## 2020-10-25 ENCOUNTER — Other Ambulatory Visit (HOSPITAL_COMMUNITY): Payer: Self-pay

## 2020-10-28 ENCOUNTER — Other Ambulatory Visit (HOSPITAL_COMMUNITY): Payer: Self-pay

## 2020-10-29 ENCOUNTER — Other Ambulatory Visit (HOSPITAL_COMMUNITY): Payer: Self-pay

## 2020-10-31 ENCOUNTER — Other Ambulatory Visit (HOSPITAL_COMMUNITY): Payer: Self-pay

## 2020-11-01 ENCOUNTER — Other Ambulatory Visit (HOSPITAL_COMMUNITY): Payer: Self-pay

## 2020-11-01 MED ORDER — CARESTART COVID-19 HOME TEST VI KIT
PACK | 0 refills | Status: DC
  Filled 2020-11-01: qty 4, 4d supply, fill #0

## 2020-11-05 ENCOUNTER — Other Ambulatory Visit (HOSPITAL_COMMUNITY): Payer: Self-pay

## 2020-11-08 ENCOUNTER — Other Ambulatory Visit (HOSPITAL_COMMUNITY): Payer: Self-pay

## 2020-11-11 ENCOUNTER — Other Ambulatory Visit (HOSPITAL_COMMUNITY): Payer: Self-pay

## 2020-12-04 ENCOUNTER — Other Ambulatory Visit (HOSPITAL_COMMUNITY): Payer: Self-pay

## 2020-12-04 MED ORDER — KETOCONAZOLE 2 % EX SHAM
MEDICATED_SHAMPOO | CUTANEOUS | 3 refills | Status: DC
  Filled 2020-12-04: qty 120, 30d supply, fill #0
  Filled 2021-02-15: qty 120, 30d supply, fill #1
  Filled 2021-04-01: qty 120, 30d supply, fill #2
  Filled 2021-05-15: qty 120, 30d supply, fill #3

## 2020-12-04 MED ORDER — FLUOCINOLONE ACETONIDE SCALP 0.01 % EX OIL
TOPICAL_OIL | CUTANEOUS | 3 refills | Status: DC
  Filled 2020-12-04: qty 118.28, 30d supply, fill #0
  Filled 2021-02-15: qty 118.28, 30d supply, fill #1
  Filled 2021-04-01: qty 118.28, 30d supply, fill #2
  Filled 2021-05-15: qty 118.28, 30d supply, fill #3

## 2020-12-05 ENCOUNTER — Other Ambulatory Visit (HOSPITAL_COMMUNITY): Payer: Self-pay

## 2020-12-06 ENCOUNTER — Other Ambulatory Visit (HOSPITAL_BASED_OUTPATIENT_CLINIC_OR_DEPARTMENT_OTHER): Payer: Self-pay

## 2020-12-06 MED ORDER — FLUARIX QUADRIVALENT 0.5 ML IM SUSY
PREFILLED_SYRINGE | INTRAMUSCULAR | 0 refills | Status: DC
  Filled 2020-12-06: qty 0.5, 1d supply, fill #0

## 2021-02-15 ENCOUNTER — Other Ambulatory Visit (HOSPITAL_COMMUNITY): Payer: Self-pay

## 2021-02-22 ENCOUNTER — Other Ambulatory Visit (HOSPITAL_COMMUNITY): Payer: Self-pay

## 2021-02-22 MED ORDER — COVID-19 AT HOME ANTIGEN TEST VI KIT
PACK | 0 refills | Status: DC
  Filled 2021-02-22: qty 2, 4d supply, fill #0

## 2021-03-13 ENCOUNTER — Other Ambulatory Visit (HOSPITAL_COMMUNITY): Payer: Self-pay

## 2021-03-27 ENCOUNTER — Other Ambulatory Visit (HOSPITAL_COMMUNITY): Payer: Self-pay

## 2021-04-01 ENCOUNTER — Other Ambulatory Visit (HOSPITAL_COMMUNITY): Payer: Self-pay

## 2021-04-05 ENCOUNTER — Other Ambulatory Visit (HOSPITAL_COMMUNITY): Payer: Self-pay

## 2021-04-17 ENCOUNTER — Other Ambulatory Visit (HOSPITAL_COMMUNITY): Payer: Self-pay

## 2021-04-17 MED ORDER — CARESTART COVID-19 HOME TEST VI KIT
PACK | 0 refills | Status: DC
  Filled 2021-04-17: qty 4, 4d supply, fill #0

## 2021-05-15 ENCOUNTER — Other Ambulatory Visit (HOSPITAL_COMMUNITY): Payer: Self-pay

## 2021-05-15 ENCOUNTER — Encounter: Payer: Self-pay | Admitting: Podiatry

## 2021-05-15 ENCOUNTER — Ambulatory Visit (INDEPENDENT_AMBULATORY_CARE_PROVIDER_SITE_OTHER): Payer: No Typology Code available for payment source

## 2021-05-15 ENCOUNTER — Ambulatory Visit (INDEPENDENT_AMBULATORY_CARE_PROVIDER_SITE_OTHER): Payer: No Typology Code available for payment source | Admitting: Podiatry

## 2021-05-15 ENCOUNTER — Telehealth: Payer: Self-pay | Admitting: Podiatry

## 2021-05-15 DIAGNOSIS — M722 Plantar fascial fibromatosis: Secondary | ICD-10-CM

## 2021-05-15 DIAGNOSIS — M79671 Pain in right foot: Secondary | ICD-10-CM | POA: Diagnosis not present

## 2021-05-15 DIAGNOSIS — M79672 Pain in left foot: Secondary | ICD-10-CM

## 2021-05-15 MED ORDER — DEXAMETHASONE SODIUM PHOSPHATE 120 MG/30ML IJ SOLN
8.0000 mg | Freq: Once | INTRAMUSCULAR | Status: AC
  Administered 2021-05-15: 8 mg via INTRA_ARTICULAR

## 2021-05-15 NOTE — Patient Instructions (Signed)

## 2021-05-15 NOTE — Progress Notes (Signed)
?  Subjective:  ?Patient ID: Melissa Evans, female    DOB: [birth year],   MRN: [id number] ? ?Chief Complaint  ?Patient presents with  ? Foot Pain  ?   bil Pf-requesting injection/left foot has pain near the top of foot  ? ? ?51 y.o. female presents for concern of bilateral plantar fasciitis. She was seen 5+ years ago for this problem and had injections then that really helped. She also has orthotics that have been helpful. She is not diabetic . Denies any other pedal complaints. Denies n/v/f/c.  ? ?Past Medical History:  ?Diagnosis Date  ? Allergy   ? seasonal and environmental  ? LPRD (laryngopharyngeal reflux disease)   ? PMS (premenstrual syndrome)   ? ? ?Objective:  ?Physical Exam: ?Vascular: DP/PT pulses 2/4 bilateral. CFT <3 seconds. Normal hair growth on digits. No edema.  ?Skin. No lacerations or abrasions bilateral feet.  ?Musculoskeletal: MMT 5/5 bilateral lower extremities in DF, PF, Inversion and Eversion. Deceased ROM in DF of ankle joint. Tender to medial calcaneal tubercle bilateral. Midly tender over dorsum of left foot as well. No pain along medial arch achilles or PT tendon.  ?Neurological: Sensation intact to light touch.  ? ?Assessment:  ? ?1. Bilateral plantar fasciitis   ? ? ? ?Plan:  ?Patient was evaluated and treated and all questions answered. ?X-rays reviewed and discussed with patient. No acute fractures or dislocations noted.  Minimal spurring noted to plantar calcaneus bilateral. Mild suprring noted to dorsum of right midfoot.  ?Discussed plantar fasciitis with patient.  ?X-rays reviewed and discussed with patient. No acute fractures or dislocations noted. Mild spurring noted at inferior calcaneus.  ?Discussed treatment options including, ice, NSAIDS, supportive shoes, bracing, and stretching. Stretching exercises provided to be done on a daily basis.   ?Anti-inflammatories and supportive shoes.  ?Patient requesting injection today. Procedure note below.   ?Follow-up 6 weeks or sooner  if any problems arise. In the meantime, encouraged to call the office with any questions, concerns, change in symptoms.  ? ?Procedure:  ?Discussed etiology, pathology, conservative vs. surgical therapies. At this time a plantar fascial injection was recommended.  The patient agreed and a sterile skin prep was applied.  An injection consisting of  dexamethasone and marcaine mixture was infiltrated at the point of maximal tenderness on the bilateral Heel.  Bandaid applied. The patient tolerated this well and was given instructions for aftercare.   ? ? ?Louann Sjogren, DPM  ? ? ?

## 2021-05-15 NOTE — Telephone Encounter (Signed)
Pt called multiple times asking if we had received her referral. We have not and the referral coordinator did call the pcp office to get them to refax it per pts request. ?

## 2021-06-23 ENCOUNTER — Telehealth: Payer: Self-pay | Admitting: *Deleted

## 2021-06-23 NOTE — Telephone Encounter (Signed)
Called and gave recommendations per physician, verbalized understanding.

## 2021-06-23 NOTE — Telephone Encounter (Signed)
Patient said that the injection did help but would like more suggestions/tips  on how  to relieve more pain at home. Please advise. ?

## 2021-06-26 ENCOUNTER — Ambulatory Visit: Payer: No Typology Code available for payment source | Admitting: Podiatry

## 2021-07-12 ENCOUNTER — Other Ambulatory Visit (HOSPITAL_COMMUNITY): Payer: Self-pay

## 2021-07-15 ENCOUNTER — Other Ambulatory Visit (HOSPITAL_COMMUNITY): Payer: Self-pay

## 2021-07-16 ENCOUNTER — Other Ambulatory Visit (HOSPITAL_COMMUNITY): Payer: Self-pay

## 2021-07-16 MED ORDER — FLUOCINOLONE ACETONIDE SCALP 0.01 % EX OIL
TOPICAL_OIL | CUTANEOUS | 4 refills | Status: AC
  Filled 2021-07-16: qty 118.28, 60d supply, fill #0
  Filled 2021-08-22: qty 118.28, 30d supply, fill #0
  Filled 2021-09-17: qty 118.28, 30d supply, fill #1
  Filled 2021-11-29: qty 118.28, 30d supply, fill #2
  Filled 2021-12-24: qty 118.28, 30d supply, fill #3
  Filled 2022-01-23 – 2022-02-06 (×2): qty 118.28, 30d supply, fill #4

## 2021-07-16 MED ORDER — KETOCONAZOLE 2 % EX SHAM
MEDICATED_SHAMPOO | CUTANEOUS | 5 refills | Status: AC
  Filled 2021-07-16 – 2021-09-17 (×3): qty 120, 30d supply, fill #0
  Filled 2021-11-29: qty 120, 30d supply, fill #1
  Filled 2021-12-24: qty 120, 30d supply, fill #2
  Filled 2022-01-23 – 2022-02-06 (×2): qty 120, 30d supply, fill #3
  Filled 2022-03-09 – 2022-06-05 (×3): qty 120, 30d supply, fill #4

## 2021-07-26 ENCOUNTER — Other Ambulatory Visit (HOSPITAL_COMMUNITY): Payer: Self-pay

## 2021-08-22 ENCOUNTER — Other Ambulatory Visit (HOSPITAL_COMMUNITY): Payer: Self-pay

## 2021-08-25 ENCOUNTER — Other Ambulatory Visit (HOSPITAL_COMMUNITY): Payer: Self-pay

## 2021-09-03 ENCOUNTER — Other Ambulatory Visit (HOSPITAL_COMMUNITY): Payer: Self-pay

## 2021-09-17 ENCOUNTER — Other Ambulatory Visit (HOSPITAL_COMMUNITY): Payer: Self-pay | Admitting: Pharmacist

## 2021-09-18 ENCOUNTER — Other Ambulatory Visit (HOSPITAL_COMMUNITY): Payer: Self-pay

## 2021-09-26 ENCOUNTER — Other Ambulatory Visit (HOSPITAL_COMMUNITY): Payer: Self-pay

## 2021-11-03 ENCOUNTER — Other Ambulatory Visit: Payer: Self-pay | Admitting: Internal Medicine

## 2021-11-03 DIAGNOSIS — Z1231 Encounter for screening mammogram for malignant neoplasm of breast: Secondary | ICD-10-CM

## 2021-11-04 ENCOUNTER — Ambulatory Visit
Admission: RE | Admit: 2021-11-04 | Discharge: 2021-11-04 | Disposition: A | Discharge status: Home / Self Care | Payer: No Typology Code available for payment source | Source: Ambulatory Visit | Attending: Internal Medicine | Admitting: Internal Medicine

## 2021-11-04 DIAGNOSIS — Z1231 Encounter for screening mammogram for malignant neoplasm of breast: Secondary | ICD-10-CM

## 2021-11-29 ENCOUNTER — Other Ambulatory Visit (HOSPITAL_COMMUNITY): Payer: Self-pay

## 2021-12-05 ENCOUNTER — Other Ambulatory Visit (HOSPITAL_BASED_OUTPATIENT_CLINIC_OR_DEPARTMENT_OTHER): Payer: Self-pay

## 2021-12-05 MED ORDER — FLUARIX QUADRIVALENT 0.5 ML IM SUSY
PREFILLED_SYRINGE | INTRAMUSCULAR | 0 refills | Status: DC
  Filled 2021-12-05: qty 0.5, 1d supply, fill #0

## 2021-12-24 ENCOUNTER — Other Ambulatory Visit (HOSPITAL_COMMUNITY): Payer: Self-pay | Admitting: Pharmacist

## 2021-12-24 ENCOUNTER — Other Ambulatory Visit (HOSPITAL_COMMUNITY): Payer: Self-pay

## 2021-12-25 ENCOUNTER — Other Ambulatory Visit (HOSPITAL_COMMUNITY): Payer: Self-pay

## 2021-12-29 ENCOUNTER — Other Ambulatory Visit (HOSPITAL_COMMUNITY): Payer: Self-pay

## 2022-01-23 ENCOUNTER — Other Ambulatory Visit: Payer: Self-pay

## 2022-02-03 ENCOUNTER — Other Ambulatory Visit (HOSPITAL_COMMUNITY): Payer: Self-pay

## 2022-02-06 ENCOUNTER — Other Ambulatory Visit (HOSPITAL_COMMUNITY): Payer: Self-pay

## 2022-02-12 ENCOUNTER — Other Ambulatory Visit (HOSPITAL_COMMUNITY): Payer: Self-pay

## 2022-02-20 ENCOUNTER — Ambulatory Visit: Payer: Self-pay

## 2022-02-20 NOTE — Telephone Encounter (Signed)
Summary: advise for congestion   Pt called having congestion and would like advise.  She does not have a primary care at the present.  She has an appt coming up with one.  CB@  [phone]             Called pt - LMOMTCB

## 2022-02-20 NOTE — Telephone Encounter (Signed)
  Chief Complaint: URI - advice only Symptoms:  Frequency:  Pertinent Negatives: Patient denies  Disposition: [] ED /[] Urgent Care (no appt availability in office) / [] Appointment(In office/virtual)/ [x]  [facility]/ [] Home Care/ [] Refused Recommended Disposition /[] [facility]/ []  Follow-up with PCP Additional Notes: Called pt back. Pt has already made a vv appointment. Pt will call back if she has any questions or needs additional help.    Summary: advise for congestion   Pt called having congestion and would like advise.  She does not have a primary care at the present.  She has an appt coming up with one.  CB@  [phone]         Reason for Disposition  General information question, no triage required and triager able to answer question  Answer Assessment - Initial Assessment Questions 1. REASON FOR CALL or QUESTION: "What is your reason for calling today?" or "How can I best help you?" or "What question do you have that I can help answer?"     PT states that she will use vv for appt. Pt has already scheduled.  Protocols used: Information Only Call - No Triage-A-AH

## 2022-03-03 ENCOUNTER — Ambulatory Visit (INDEPENDENT_AMBULATORY_CARE_PROVIDER_SITE_OTHER): Payer: 59 | Admitting: Internal Medicine

## 2022-03-03 ENCOUNTER — Encounter: Payer: Self-pay | Admitting: Internal Medicine

## 2022-03-03 VITALS — BP 130/85 | HR 72 | Temp 98.5°F | Ht 67.5 in | Wt 213.3 lb

## 2022-03-03 DIAGNOSIS — E559 Vitamin D deficiency, unspecified: Secondary | ICD-10-CM

## 2022-03-03 DIAGNOSIS — K219 Gastro-esophageal reflux disease without esophagitis: Secondary | ICD-10-CM | POA: Diagnosis not present

## 2022-03-03 DIAGNOSIS — Z23 Encounter for immunization: Secondary | ICD-10-CM | POA: Diagnosis not present

## 2022-03-03 DIAGNOSIS — Z1211 Encounter for screening for malignant neoplasm of colon: Secondary | ICD-10-CM

## 2022-03-03 NOTE — Progress Notes (Addendum)
New Patient Office Visit     CC/Reason for Visit: Establish care, discuss chronic conditions Previous PCP: Fara Olden Last Visit: Unknown  HPI: Melissa Evans is a 52 y.o. female who is coming in today for the above mentioned reasons. Past Medical History is significant for: Vitamin D deficiency.  She had a total hysterectomy with bilateral salpingo-oophorectomy in 2012.  She does not smoke, she drinks alcohol only occasionally, she has no known drug allergies, no significant family history, she is overdue for shingles and COVID vaccination.  She has never had a colonoscopy, she had a mammogram in 9/23, she was advised that she no longer needed Pap smears given her hysterectomy.  She works for [employer] in [employer], she has 2 daughters ages 66 and 65.   Past Medical/Surgical History: Past Medical History:  Diagnosis Date   Allergy    seasonal and environmental   LPRD (laryngopharyngeal reflux disease)    PMS (premenstrual syndrome)     Past Surgical History:  Procedure Laterality Date   ABDOMINAL HYSTERECTOMY     LEFT OOPHORECTOMY     WISDOM TOOTH EXTRACTION      Social History:  reports that she has never smoked. She has never used smokeless tobacco. She reports current alcohol use. She reports that she does not use drugs.  Allergies: No Known Allergies  Family History:  Family History  Problem Relation Age of Onset   Lung cancer Mother    Lung cancer Maternal Grandfather    Fibroids Sister    Parkinsonism Maternal Grandmother    Diabetes Maternal Grandmother    Colon polyps Brother    Colon cancer Neg Hx    Esophageal cancer Neg Hx    Rectal cancer Neg Hx    Stomach cancer Neg Hx      Current Outpatient Medications:    acetaminophen (TYLENOL) 500 MG tablet, Take 1,000 mg by mouth every 6 (six) hours as needed. pain, Disp: , Rfl:    cetirizine (ZYRTEC) 10 MG tablet, Take 10 mg by mouth daily as needed. allergies, Disp: , Rfl:     Cholecalciferol (VITAMIN D3) 50000 UNITS CAPS, Take 1 capsule by mouth once a week., Disp: 8 capsule, Rfl: 0   Fluocinolone Acetonide Body 0.01 % OIL, Apply topically 3 times a day, Disp: 118.28 mL, Rfl: 3   Fluocinolone Acetonide Scalp 0.01 % OIL, Apply to the affected area at bedtime twice a month as needed., Disp: 118.28 mL, Rfl: 4   hydrocortisone 2.5 % cream, Apply topically 2 (two) times daily. Apply to neck rash bid, Disp: 30 g, Rfl: 1   Ibuprofen 200 MG CAPS, Take 1,000 mg by mouth 2 (two) times daily as needed., Disp: , Rfl:    ketoconazole (NIZORAL) 2 % shampoo, Apply topically once a week as directed, Disp: 120 mL, Rfl: 5   loratadine (CLARITIN) 10 MG tablet, Take 10 mg by mouth daily., Disp: , Rfl:    ondansetron (ZOFRAN) 4 MG tablet, 1 tablet, Disp: , Rfl:    Probiotic Product (PROBIOTIC ADVANCED PO), Take by mouth., Disp: , Rfl:    scopolamine (TRANSDERM-SCOP) 1 MG/3DAYS, Place one patch behind ear one time for motion sickness, Disp: 1 patch, Rfl: 0   triamcinolone cream (KENALOG) 0.1 %, , Disp: , Rfl:   Review of Systems:  Negative except as indicated in HPI.   Physical Exam: Vitals:   03/03/22 1523 03/03/22 1527  BP: (!) 130/91 130/85  Pulse: 72   Temp: 98.5 F (  36.9 C)   TempSrc: Oral   SpO2: 98%   Weight: 213 lb 4.8 oz (96.8 kg)   Height: 5' 7.5" (1.715 m)    Body mass index is 32.91 kg/m.  Physical Exam Vitals reviewed.  Constitutional:      Appearance: Normal appearance.  HENT:     Head: Normocephalic and atraumatic.  Eyes:     Conjunctiva/sclera: Conjunctivae normal.  Skin:    General: Skin is warm and dry.  Neurological:     General: No focal deficit present.     Mental Status: She is alert and oriented to person, place, and time.  Psychiatric:        Mood and Affect: Mood normal.        Behavior: Behavior normal.        Thought Content: Thought content normal.        Judgment: Judgment normal.       Impression and Plan:  Screening for  colon cancer - Plan: Ambulatory referral to Gastroenterology, Ambulatory referral to Gastroenterology  Immunization due  Need for shingles vaccine - Plan: Zoster Recombinant (Shingrix )  Vitamin D deficiency  Gastroesophageal reflux disease, unspecified whether esophagitis present  -GI referral placed for screening colonoscopy as she is now overdue. -For shingles vaccine administered today. -She will schedule close follow-up for annual preventive exam as she is overdue. -Check Vit D when she returns for labs given her h/o Vit D deficiency.  Time spent: 31 minutes reviewing chart, interviewing and examining patient and formulating plan of care.       Lelon Frohlich, MD [facility]

## 2022-03-09 ENCOUNTER — Other Ambulatory Visit (HOSPITAL_COMMUNITY): Payer: Self-pay

## 2022-03-10 ENCOUNTER — Other Ambulatory Visit: Payer: Self-pay

## 2022-03-10 ENCOUNTER — Other Ambulatory Visit (HOSPITAL_COMMUNITY): Payer: Self-pay

## 2022-03-10 ENCOUNTER — Encounter (HOSPITAL_COMMUNITY): Payer: Self-pay

## 2022-03-13 ENCOUNTER — Other Ambulatory Visit: Payer: Self-pay

## 2022-03-31 ENCOUNTER — Other Ambulatory Visit (HOSPITAL_COMMUNITY): Payer: Self-pay

## 2022-04-10 DIAGNOSIS — H5212 Myopia, left eye: Secondary | ICD-10-CM | POA: Diagnosis not present

## 2022-04-24 ENCOUNTER — Other Ambulatory Visit (HOSPITAL_COMMUNITY): Payer: Self-pay

## 2022-04-24 ENCOUNTER — Ambulatory Visit (AMBULATORY_SURGERY_CENTER): Payer: 59

## 2022-04-24 VITALS — Ht 67.0 in | Wt 215.0 lb

## 2022-04-24 DIAGNOSIS — Z1211 Encounter for screening for malignant neoplasm of colon: Secondary | ICD-10-CM

## 2022-04-24 MED ORDER — NA SULFATE-K SULFATE-MG SULF 17.5-3.13-1.6 GM/177ML PO SOLN
1.0000 | Freq: Once | ORAL | 0 refills | Status: AC
  Filled 2022-04-24: qty 354, 2d supply, fill #0

## 2022-04-24 NOTE — Progress Notes (Signed)
Pre visit completed via phone call; Patient verified name, DOB, and address;  No egg or soy allergy known to patient;  No issues known to pt with past sedation with any surgeries or procedures; Patient denies ever being told they had issues or difficulty with intubation;  No FH of Malignant Hyperthermia Pt is not on diet pills Pt is not on home 02  Pt is not on blood thinners  Pt denies issues with constipation;  No A fib or A flutter Have any cardiac testing pending--NO Pt instructed to use Singlecare.com or GoodRx for a price reduction on prep   Insurance verified during [place] appt=[facility] [employer]  Patient's chart reviewed by Osvaldo Angst CNRA prior to previsit and patient appropriate for the [place].  Previsit completed and red dot placed by patient's name on their procedure day (on provider's schedule).

## 2022-04-27 ENCOUNTER — Encounter: Payer: Self-pay | Admitting: Gastroenterology

## 2022-05-02 ENCOUNTER — Other Ambulatory Visit (HOSPITAL_COMMUNITY): Payer: Self-pay

## 2022-05-15 ENCOUNTER — Ambulatory Visit (AMBULATORY_SURGERY_CENTER): Payer: 59 | Admitting: Gastroenterology

## 2022-05-15 ENCOUNTER — Encounter: Payer: Self-pay | Admitting: Gastroenterology

## 2022-05-15 VITALS — BP 117/70 | HR 61 | Temp 97.1°F | Resp 9 | Ht 67.0 in | Wt 215.0 lb

## 2022-05-15 DIAGNOSIS — D128 Benign neoplasm of rectum: Secondary | ICD-10-CM | POA: Diagnosis not present

## 2022-05-15 DIAGNOSIS — Z1211 Encounter for screening for malignant neoplasm of colon: Secondary | ICD-10-CM

## 2022-05-15 MED ORDER — SODIUM CHLORIDE 0.9 % IV SOLN: 500.0000 mL | Freq: Once | INTRAVENOUS | Status: DC

## 2022-05-15 NOTE — Progress Notes (Signed)
Called to room to assist during endoscopic procedure.  Patient ID and intended procedure confirmed with present staff. Received instructions for my participation in the procedure from the performing physician.  

## 2022-05-15 NOTE — Progress Notes (Signed)
Pt's states no medical or surgical changes since previsit or office visit. VS assessed by E.C 

## 2022-05-15 NOTE — Progress Notes (Signed)
History and Physical:  This patient presents for endoscopic testing for: Encounter Diagnosis  Name Primary?   Colon cancer screening Yes    Average risk for colorectal cancer.  First screening exam. Patient denies chronic abdominal pain, rectal bleeding, constipation or diarrhea.   Patient is otherwise without complaints or active issues today.   Past Medical History: Past Medical History:  Diagnosis Date   Arthritis    generalized   LPRD (laryngopharyngeal reflux disease)    PMS (premenstrual syndrome)    Seasonal allergies      Past Surgical History: Past Surgical History:  Procedure Laterality Date   ABDOMINAL HYSTERECTOMY  2014   LEFT OOPHORECTOMY     WISDOM TOOTH EXTRACTION      Allergies: No Known Allergies  Outpatient Meds: Current Outpatient Medications  Medication Sig Dispense Refill   cetirizine (ZYRTEC) 10 MG tablet Take 10 mg by mouth daily as needed. allergies     Cholecalciferol (VITAMIN D3) 50000 UNITS CAPS Take 1 capsule by mouth once a week. 8 capsule 0   Fluocinolone Acetonide Body 0.01 % OIL Apply topically 3 times a day 118.28 mL 3   Fluocinolone Acetonide Scalp 0.01 % OIL Apply to the affected area at bedtime twice a month as needed. 118.28 mL 4   loratadine (CLARITIN) 10 MG tablet Take 10 mg by mouth daily.     acetaminophen (TYLENOL) 500 MG tablet Take 1,000 mg by mouth every 6 (six) hours as needed. pain (Patient not taking: Reported on 05/15/2022)     diclofenac Sodium (VOLTAREN) 1 % GEL Apply 4 g topically 4 (four) times daily as needed (as needed). (Patient not taking: Reported on 05/15/2022)     fluticasone (FLONASE) 50 MCG/ACT nasal spray Place 2 sprays into both nostrils daily. (Patient not taking: Reported on 05/15/2022)     Ibuprofen 200 MG CAPS Take 1,000 mg by mouth 2 (two) times daily as needed. (Patient not taking: Reported on 05/15/2022)     ketoconazole (NIZORAL) 2 % shampoo Apply topically once a week as directed (Patient not taking:  Reported on 05/15/2022) 120 mL 5   ondansetron (ZOFRAN) 4 MG tablet Take 4 mg by mouth every 8 (eight) hours as needed for nausea or vomiting. (Patient not taking: Reported on 05/15/2022)     Probiotic Product (PROBIOTIC ADVANCED PO) Take 1 capsule by mouth every other day. (Patient not taking: Reported on 05/15/2022)     scopolamine (TRANSDERM-SCOP) 1 MG/3DAYS Place one patch behind ear one time for motion sickness (Patient not taking: Reported on 05/15/2022) 1 patch 0   triamcinolone cream (KENALOG) 0.1 % Apply 1 Application topically daily as needed (as needed). (Patient not taking: Reported on 05/15/2022)     Current Facility-Administered Medications  Medication Dose Route Frequency Provider Last Rate Last Admin   0.9 %  sodium chloride infusion  500 mL Intravenous Once Danis, Andreas Blower, MD          ___________________________________________________________________ Objective   Exam:  BP (!) 143/84   Temp (!) 97.1 F (36.2 C) (Skin)   Ht 5\' 7"  (1.702 m)   Wt 215 lb (97.5 kg)   LMP 06/16/2011   SpO2 99%   BMI 33.67 kg/m   CV: regular , S1/S2 Resp: clear to auscultation bilaterally, normal RR and effort noted GI: soft, no tenderness, with active bowel sounds.   Assessment: Encounter Diagnosis  Name Primary?   Colon cancer screening Yes     Plan: Colonoscopy  The benefits and risks of  the planned procedure were described in detail with the patient or (when appropriate) their health care proxy.  Risks were outlined as including, but not limited to, bleeding, infection, perforation, adverse medication reaction leading to cardiac or pulmonary decompensation, pancreatitis (if ERCP).  The limitation of incomplete mucosal visualization was also discussed.  No guarantees or warranties were given.    The patient is appropriate for an endoscopic procedure in the ambulatory setting.   - Wilfrid Lund, MD

## 2022-05-15 NOTE — Patient Instructions (Signed)
Resume all of your current medications today.  YOU HAD AN ENDOSCOPIC PROCEDURE TODAY AT THE [facility]:   Refer to the procedure report that was given to you for any specific questions about what was found during the examination.  If the procedure report does not answer your questions, please call your gastroenterologist to clarify.  If you requested that your care partner not be given the details of your procedure findings, then the procedure report has been included in a sealed envelope for you to review at your convenience later.  YOU SHOULD EXPECT: Some feelings of bloating in the abdomen. Passage of more gas than usual.  Walking can help get rid of the air that was put into your GI tract during the procedure and reduce the bloating. If you had a lower endoscopy (such as a colonoscopy or flexible sigmoidoscopy) you may notice spotting of blood in your stool or on the toilet paper. If you underwent a bowel prep for your procedure, you may not have a normal bowel movement for a few days.  Please Note:  You might notice some irritation and congestion in your nose or some drainage.  This is from the oxygen used during your procedure.  There is no need for concern and it should clear up in a day or so.  SYMPTOMS TO REPORT IMMEDIATELY:  Following lower endoscopy (colonoscopy or flexible sigmoidoscopy):  Excessive amounts of blood in the stool  Significant tenderness or worsening of abdominal pains  Swelling of the abdomen that is new, acute  Fever of 100F or higher   For urgent or emergent issues, a gastroenterologist can be reached at any hour by calling [phone]. Do not use MyChart messaging for urgent concerns.    DIET:  We do recommend a small meal at first, but then you may proceed to your regular diet.  Drink plenty of fluids but you should avoid alcoholic beverages for 24 hours.  ACTIVITY:  You should plan to take it easy for the rest of today and you should NOT DRIVE  or use heavy machinery until tomorrow (because of the sedation medicines used during the test).    FOLLOW UP: Our staff will call the number listed on your records the next business day following your procedure.  We will call around 7:15- 8:00 am to check on you and address any questions or concerns that you may have regarding the information given to you following your procedure. If we do not reach you, we will leave a message.     If any biopsies were taken you will be contacted by phone or by letter within the next 1-3 weeks.  Please call us at [phone] if you have not heard about the biopsies in 3 weeks.    SIGNATURES/CONFIDENTIALITY: You and/or your care partner have signed paperwork which will be entered into your electronic medical record.  These signatures attest to the fact that that the information above on your After Visit Summary has been reviewed and is understood.  Full responsibility of the confidentiality of this discharge information lies with you and/or your care-partner.

## 2022-05-15 NOTE — Progress Notes (Signed)
Report to PACU, RN, vss, BBS= Clear.  

## 2022-05-15 NOTE — Op Note (Signed)
[facility] Patient Name: Melissa Evans Procedure Date: 05/15/2022 10:24 AM MRN: [id number] Endoscopist: Sherilyn Cooter L. Myrtie Neither , MD, [id number] Age: 52 Referring MD:  Date of Birth: [birth year] Gender: Female Account #: [id number] Procedure:                Colonoscopy Indications:              Screening for colorectal malignant neoplasm, This                            is the patient's first colonoscopy Medicines:                Monitored Anesthesia Care Procedure:                Pre-Anesthesia Assessment:                           - Prior to the procedure, a History and Physical                            was performed, and patient medications and                            allergies were reviewed. The patient's tolerance of                            previous anesthesia was also reviewed. The risks                            and benefits of the procedure and the sedation                            options and risks were discussed with the patient.                            All questions were answered, and informed consent                            was obtained. Prior Anticoagulants: The patient has                            taken no anticoagulant or antiplatelet agents. ASA                            Grade Assessment: II - A patient with mild systemic                            disease. After reviewing the risks and benefits,                            the patient was deemed in satisfactory condition to                            undergo the procedure.  After obtaining informed consent, the colonoscope                            was passed under direct vision. Throughout the                            procedure, the patient's blood pressure, pulse, and                            oxygen saturations were monitored continuously. The                            CF [id number] #[id number] was introduced through the anus                            and advanced to the the  cecum, identified by                            appendiceal orifice and ileocecal valve. The                            colonoscopy was performed without difficulty. The                            patient tolerated the procedure well. The quality                            of the bowel preparation was excellent. The                            ileocecal valve, appendiceal orifice, and rectum                            were photographed. Scope In: 10:48:58 AM Scope Out: 11:03:32 AM Scope Withdrawal Time: 0 hours 10 minutes 12 seconds  Total Procedure Duration: 0 hours 14 minutes 34 seconds  Findings:                 The perianal and digital rectal examinations were                            normal.                           Repeat examination of right colon under NBI                            performed.                           A diminutive polyp was found in the rectum. The                            polyp was sessile. The polyp was removed with a  cold snare. Resection and retrieval were complete.                           The exam was otherwise without abnormality on                            direct and retroflexion views. Complications:            No immediate complications. Estimated Blood Loss:     Estimated blood loss was minimal. Impression:               - One diminutive polyp in the rectum, removed with                            a cold snare. Resected and retrieved.                           - The examination was otherwise normal on direct                            and retroflexion views.                           - The GI Genius (intelligent endoscopy module),                            computer-aided polyp detection system powered by AI                            was utilized to detect colorectal polyps through                            enhanced visualization during colonoscopy. Recommendation:           - Patient has a contact number available  for                            emergencies. The signs and symptoms of potential                            delayed complications were discussed with the                            patient. Return to normal activities tomorrow.                            Written discharge instructions were provided to the                            patient.                           - Resume previous diet.                           - Continue present medications.                           -  Await pathology results.                           - Repeat colonoscopy is recommended for                            surveillance. The colonoscopy date will be                            determined after pathology results from today's                            exam become available for review. Macrae Wiegman L. Myrtie Neitheranis, MD 05/15/2022 11:06:12 AM This report has been signed electronically.

## 2022-05-18 ENCOUNTER — Telehealth: Payer: Self-pay

## 2022-05-18 NOTE — Telephone Encounter (Signed)
  Follow up Call-     05/15/2022   10:32 AM  Call back number  Post procedure Call Back phone  # [phone]  Permission to leave phone message Yes     Patient questions:  Do you have a fever, pain , or abdominal swelling? No. Pain Score  0 *  Have you tolerated food without any problems? Yes.    Have you been able to return to your normal activities? Yes.    Do you have any questions about your discharge instructions: Diet   No. Medications  No. Follow up visit  No.  Do you have questions or concerns about your Care? No.  Actions: * If pain score is 4 or above: No action needed, pain <4.

## 2022-05-19 ENCOUNTER — Other Ambulatory Visit (HOSPITAL_COMMUNITY): Payer: Self-pay

## 2022-05-19 ENCOUNTER — Other Ambulatory Visit: Payer: Self-pay | Admitting: Internal Medicine

## 2022-05-20 ENCOUNTER — Other Ambulatory Visit: Payer: Self-pay

## 2022-05-20 ENCOUNTER — Other Ambulatory Visit (HOSPITAL_COMMUNITY): Payer: Self-pay

## 2022-05-20 MED ORDER — FLUTICASONE PROPIONATE 50 MCG/ACT NA SUSP
2.0000 | Freq: Every day | NASAL | 11 refills | Status: AC
  Filled 2022-05-20 – 2022-06-05 (×2): qty 16, 30d supply, fill #0

## 2022-05-21 ENCOUNTER — Encounter: Payer: Self-pay | Admitting: Gastroenterology

## 2022-05-29 ENCOUNTER — Other Ambulatory Visit (HOSPITAL_COMMUNITY): Payer: Self-pay

## 2022-06-01 ENCOUNTER — Other Ambulatory Visit (HOSPITAL_COMMUNITY): Payer: Self-pay

## 2022-06-02 ENCOUNTER — Ambulatory Visit (INDEPENDENT_AMBULATORY_CARE_PROVIDER_SITE_OTHER): Payer: 59 | Admitting: Internal Medicine

## 2022-06-02 ENCOUNTER — Encounter: Payer: Self-pay | Admitting: Internal Medicine

## 2022-06-02 VITALS — BP 130/84 | HR 80 | Temp 98.5°F | Ht 68.0 in | Wt 215.6 lb

## 2022-06-02 DIAGNOSIS — Z23 Encounter for immunization: Secondary | ICD-10-CM | POA: Diagnosis not present

## 2022-06-02 DIAGNOSIS — Z0001 Encounter for general adult medical examination with abnormal findings: Secondary | ICD-10-CM | POA: Diagnosis not present

## 2022-06-02 DIAGNOSIS — R030 Elevated blood-pressure reading, without diagnosis of hypertension: Secondary | ICD-10-CM | POA: Diagnosis not present

## 2022-06-02 DIAGNOSIS — E559 Vitamin D deficiency, unspecified: Secondary | ICD-10-CM | POA: Diagnosis not present

## 2022-06-02 DIAGNOSIS — Z0000 Encounter for general adult medical examination without abnormal findings: Secondary | ICD-10-CM

## 2022-06-02 LAB — COMPREHENSIVE METABOLIC PANEL
ALT: 16 U/L (ref 0–35)
AST: 19 U/L (ref 0–37)
Albumin: 4.3 g/dL (ref 3.5–5.2)
Alkaline Phosphatase: 103 U/L (ref 39–117)
BUN: 15 mg/dL (ref 6–23)
CO2: 27 mEq/L (ref 19–32)
Calcium: 9.7 mg/dL (ref 8.4–10.5)
Chloride: 104 mEq/L (ref 96–112)
Creatinine, Ser: 0.79 mg/dL (ref 0.40–1.20)
GFR: 85.89 mL/min (ref >60.00)
Glucose, Bld: 89 mg/dL (ref 70–99)
Potassium: 4 mEq/L (ref 3.5–5.1)
Sodium: 139 mEq/L (ref 135–145)
Total Bilirubin: 0.3 mg/dL (ref 0.2–1.2)
Total Protein: 7.8 g/dL (ref 6.0–8.3)

## 2022-06-02 LAB — CBC WITH DIFFERENTIAL/PLATELET
Basophils Absolute: 0.1 10*3/uL (ref 0.0–0.1)
Basophils Relative: 1 % (ref 0.0–3.0)
Eosinophils Absolute: 0.2 10*3/uL (ref 0.0–0.7)
Eosinophils Relative: 2.9 % (ref 0.0–5.0)
HCT: 39.9 % (ref 36.0–46.0)
Hemoglobin: 12.9 g/dL (ref 12.0–15.0)
Lymphocytes Relative: 43.8 % (ref 12.0–46.0)
Lymphs Abs: 2.7 10*3/uL (ref 0.7–4.0)
MCHC: 32.2 g/dL (ref 30.0–36.0)
MCV: 84.5 fl (ref 78.0–100.0)
Monocytes Absolute: 0.5 10*3/uL (ref 0.1–1.0)
Monocytes Relative: 7.8 % (ref 3.0–12.0)
Neutro Abs: 2.7 10*3/uL (ref 1.4–7.7)
Neutrophils Relative %: 44.5 % (ref 43.0–77.0)
Platelets: 260 10*3/uL (ref 150.0–400.0)
RBC: 4.72 Mil/uL (ref 3.87–5.11)
RDW: 14.2 % (ref 11.5–15.5)
WBC: 6.2 10*3/uL (ref 4.0–10.5)

## 2022-06-02 LAB — LIPID PANEL
Cholesterol: 167 mg/dL (ref 0–200)
HDL: 51.6 mg/dL (ref >39.00)
LDL Cholesterol: 104 mg/dL — ABNORMAL HIGH (ref 0–99)
NonHDL: 115.56
Total CHOL/HDL Ratio: 3
Triglycerides: 58 mg/dL (ref 0.0–149.0)
VLDL: 11.6 mg/dL (ref 0.0–40.0)

## 2022-06-02 LAB — VITAMIN B12: Vitamin B-12: 398 pg/mL (ref 211–911)

## 2022-06-02 LAB — TSH: TSH: 1.67 u[IU]/mL (ref 0.35–5.50)

## 2022-06-02 LAB — VITAMIN D 25 HYDROXY (VIT D DEFICIENCY, FRACTURES): VITD: 31.85 ng/mL (ref 30.00–100.00)

## 2022-06-02 NOTE — Progress Notes (Signed)
Established Patient Office Visit     CC/Reason for Visit: Annual preventive exam  HPI: Melissa Evans is a 52 y.o. female who is coming in today for the above mentioned reasons. Past Medical History is significant for: Vitamin D deficiency.  She is feeling well today has no acute concerns or complaints.  She was noted to have elevated blood pressure during her colonoscopy and as well as today.  She has routine eye and dental care.  She is requesting her second shingles vaccine, other immunizations are up-to-date, cancer screening is up-to-date.  She had a total hysterectomy and thus no longer needs Pap smears.   Past Medical/Surgical History: Past Medical History:  Diagnosis Date   Arthritis    generalized   LPRD (laryngopharyngeal reflux disease)    PMS (premenstrual syndrome)    Seasonal allergies     Past Surgical History:  Procedure Laterality Date   ABDOMINAL HYSTERECTOMY  2014   LEFT OOPHORECTOMY     WISDOM TOOTH EXTRACTION      Social History:  reports that she has never smoked. She has never used smokeless tobacco. She reports that she does not currently use alcohol. She reports that she does not use drugs.  Allergies: No Known Allergies  Family History:  Family History  Problem Relation Age of Onset   Lung cancer Mother    Lung cancer Maternal Grandfather    Fibroids Sister    Parkinsonism Maternal Grandmother    Diabetes Maternal Grandmother    Colon polyps Brother    Colon cancer Neg Hx    Esophageal cancer Neg Hx    Rectal cancer Neg Hx    Stomach cancer Neg Hx      Current Outpatient Medications:    acetaminophen (TYLENOL) 500 MG tablet, Take 1,000 mg by mouth every 6 (six) hours as needed. pain, Disp: , Rfl:    cetirizine (ZYRTEC) 10 MG tablet, Take 10 mg by mouth daily as needed. allergies, Disp: , Rfl:    Cholecalciferol (VITAMIN D3) 50000 UNITS CAPS, Take 1 capsule by mouth once a week., Disp: 8 capsule, Rfl: 0   diclofenac Sodium  (VOLTAREN) 1 % GEL, Apply 4 g topically 4 (four) times daily as needed (as needed)., Disp: , Rfl:    Fluocinolone Acetonide Body 0.01 % OIL, Apply topically 3 times a day, Disp: 118.28 mL, Rfl: 3   Fluocinolone Acetonide Scalp 0.01 % OIL, Apply to the affected area at bedtime twice a month as needed., Disp: 118.28 mL, Rfl: 4   fluticasone (FLONASE) 50 MCG/ACT nasal spray, Place 2 sprays into both nostrils daily., Disp: 16 g, Rfl: 11   Ibuprofen 200 MG CAPS, Take 1,000 mg by mouth 2 (two) times daily as needed., Disp: , Rfl:    ketoconazole (NIZORAL) 2 % shampoo, Apply topically once a week as directed, Disp: 120 mL, Rfl: 5   loratadine (CLARITIN) 10 MG tablet, Take 10 mg by mouth daily., Disp: , Rfl:    ondansetron (ZOFRAN) 4 MG tablet, Take 4 mg by mouth every 8 (eight) hours as needed for nausea or vomiting., Disp: , Rfl:    Probiotic Product (PROBIOTIC ADVANCED PO), Take 1 capsule by mouth every other day., Disp: , Rfl:    scopolamine (TRANSDERM-SCOP) 1 MG/3DAYS, Place one patch behind ear one time for motion sickness, Disp: 1 patch, Rfl: 0   triamcinolone cream (KENALOG) 0.1 %, Apply 1 Application topically daily as needed (as needed)., Disp: , Rfl:   Review of  Systems:  Negative unless indicated in HPI.   Physical Exam: Vitals:   06/02/22 0944  BP: 130/84  Pulse: 80  Temp: 98.5 F (36.9 C)  TempSrc: Oral  SpO2: 98%  Weight: 215 lb 9.6 oz (97.8 kg)  Height:  (1.727 m)    Body mass index is 32.78 kg/m.   Physical Exam Vitals reviewed.  Constitutional:      General: She is not in acute distress.    Appearance: Normal appearance. She is not ill-appearing, toxic-appearing or diaphoretic.  HENT:     Head: Normocephalic.     Right Ear: Tympanic membrane, ear canal and external ear normal. There is no impacted cerumen.     Left Ear: Tympanic membrane, ear canal and external ear normal. There is no impacted cerumen.     Nose: Nose normal.     Mouth/Throat:     Mouth:  Mucous membranes are moist.     Pharynx: Oropharynx is clear. No oropharyngeal exudate or posterior oropharyngeal erythema.  Eyes:     General: No scleral icterus.       Right eye: No discharge.        Left eye: No discharge.     Conjunctiva/sclera: Conjunctivae normal.     Pupils: Pupils are equal, round, and reactive to light.  Neck:     Vascular: No carotid bruit.  Cardiovascular:     Rate and Rhythm: Normal rate and regular rhythm.     Pulses: Normal pulses.     Heart sounds: Normal heart sounds.  Pulmonary:     Effort: Pulmonary effort is normal. No respiratory distress.     Breath sounds: Normal breath sounds.  Abdominal:     General: Abdomen is flat. Bowel sounds are normal.     Palpations: Abdomen is soft.  Musculoskeletal:        General: Normal range of motion.     Cervical back: Normal range of motion.  Skin:    General: Skin is warm and dry.  Neurological:     General: No focal deficit present.     Mental Status: She is alert and oriented to person, place, and time. Mental status is at baseline.  Psychiatric:        Mood and Affect: Mood normal.        Behavior: Behavior normal.        Thought Content: Thought content normal.        Judgment: Judgment normal.     Flowsheet Row Office Visit from 06/02/2022 in [facility] [place]  PHQ-9 Total Score 0        Impression and Plan:  Encounter for preventive health examination  Vitamin D deficiency - Plan: Vitamin D, 25-hydroxy  Need for shingles vaccine - Plan: Zoster Recombinant (Shingrix )  Elevated BP without diagnosis of hypertension - Plan: CBC with Differential/Platelet, Comprehensive metabolic panel, Lipid panel, TSH, Vitamin B12  -Recommend routine eye and dental care. -Healthy lifestyle discussed in detail. -Labs to be updated today. -Prostate cancer screening: N/A Health Maintenance  Topic Date Due   HIV Screening  Never done   Hepatitis C Screening: USPSTF  Recommendation to screen - Ages 86-79 yo.  Never done   COVID-19 Vaccine (4 - 2023-24 season) 06/18/2022*   Flu Shot  09/10/2022   Mammogram  11/05/2023   DTaP/Tdap/Td vaccine (2 - Td or Tdap) 07/11/2027   Colon Cancer Screening  05/14/2029   Zoster (Shingles) Vaccine  Completed   HPV Vaccine  Aged Out  *Topic was postponed. The date shown is not the original due date.          Chaya Jan, MD [facility]

## 2022-06-05 ENCOUNTER — Other Ambulatory Visit (HOSPITAL_COMMUNITY): Payer: Self-pay

## 2022-09-11 ENCOUNTER — Encounter: Payer: Self-pay | Admitting: Internal Medicine

## 2022-09-11 DIAGNOSIS — G4730 Sleep apnea, unspecified: Secondary | ICD-10-CM

## 2022-10-08 ENCOUNTER — Other Ambulatory Visit (HOSPITAL_COMMUNITY): Payer: Self-pay

## 2022-10-08 ENCOUNTER — Other Ambulatory Visit: Payer: Self-pay | Admitting: Internal Medicine

## 2022-10-13 ENCOUNTER — Other Ambulatory Visit (HOSPITAL_COMMUNITY): Payer: Self-pay

## 2022-10-13 ENCOUNTER — Other Ambulatory Visit: Payer: Self-pay

## 2022-10-13 MED ORDER — ONDANSETRON HCL 4 MG PO TABS
4.0000 mg | ORAL_TABLET | Freq: Three times a day (TID) | ORAL | 0 refills | Status: DC | PRN
  Filled 2022-10-13 – 2022-12-05 (×2): qty 20, 7d supply, fill #0

## 2022-10-23 ENCOUNTER — Other Ambulatory Visit (HOSPITAL_COMMUNITY): Payer: Self-pay

## 2022-12-05 ENCOUNTER — Other Ambulatory Visit (HOSPITAL_COMMUNITY): Payer: Self-pay

## 2022-12-07 ENCOUNTER — Other Ambulatory Visit: Payer: Self-pay | Admitting: Internal Medicine

## 2022-12-07 DIAGNOSIS — Z1231 Encounter for screening mammogram for malignant neoplasm of breast: Secondary | ICD-10-CM

## 2022-12-09 ENCOUNTER — Ambulatory Visit
Admission: RE | Admit: 2022-12-09 | Discharge: 2022-12-09 | Disposition: A | Discharge status: Home / Self Care | Payer: 59 | Source: Ambulatory Visit | Attending: Internal Medicine | Admitting: Internal Medicine

## 2022-12-09 DIAGNOSIS — Z1231 Encounter for screening mammogram for malignant neoplasm of breast: Secondary | ICD-10-CM

## 2022-12-12 ENCOUNTER — Other Ambulatory Visit (HOSPITAL_COMMUNITY): Payer: Self-pay

## 2022-12-14 ENCOUNTER — Encounter: Payer: Self-pay | Admitting: Internal Medicine

## 2022-12-19 ENCOUNTER — Other Ambulatory Visit (HOSPITAL_COMMUNITY): Payer: Self-pay

## 2022-12-29 ENCOUNTER — Other Ambulatory Visit (HOSPITAL_COMMUNITY): Payer: Self-pay

## 2023-06-08 ENCOUNTER — Ambulatory Visit (INDEPENDENT_AMBULATORY_CARE_PROVIDER_SITE_OTHER): Admitting: Family Medicine

## 2023-06-08 ENCOUNTER — Ambulatory Visit
Admission: RE | Admit: 2023-06-08 | Discharge: 2023-06-08 | Disposition: A | Discharge status: Home / Self Care | Source: Ambulatory Visit | Attending: Family Medicine

## 2023-06-08 ENCOUNTER — Other Ambulatory Visit (HOSPITAL_COMMUNITY): Payer: Self-pay

## 2023-06-08 VITALS — BP 126/82 | Ht 67.0 in | Wt 210.0 lb

## 2023-06-08 DIAGNOSIS — M542 Cervicalgia: Secondary | ICD-10-CM

## 2023-06-08 MED ORDER — MELOXICAM 15 MG PO TABS
15.0000 mg | ORAL_TABLET | Freq: Every day | ORAL | 0 refills | Status: AC
Start: 2023-06-08 — End: ?
  Filled 2023-06-08: qty 10, 10d supply, fill #0

## 2023-06-08 MED ORDER — CYCLOBENZAPRINE HCL 10 MG PO TABS
10.0000 mg | ORAL_TABLET | Freq: Every evening | ORAL | 0 refills | Status: AC | PRN
  Filled 2023-06-08: qty 20, 20d supply, fill #0

## 2023-06-08 NOTE — Progress Notes (Unsigned)
 PCP: Zilphia Hilt, Charyl Coppersmith, MD  Subjective:   HPI: Patient is a 53 y.o. female here for neck pain ongoing for the past 3 weeks. Started after falling asleep in couch in an odd position.  Never had pain like this before. Pain on left side > right neck Radiation of pain down to trapezius on left side.  Reports feeling weakness on right arm, "feels like a strain" with anything  that requires strength like opening jars.  Has tried message, heat, ibuprofen, and stretching which has helped.  No numbness or tingling.  No prior imaging of back.      Past Medical History:  Diagnosis Date   Arthritis    generalized   LPRD (laryngopharyngeal reflux disease)    PMS (premenstrual syndrome)    Seasonal allergies     Current Outpatient Medications on File Prior to Visit  Medication Sig Dispense Refill   acetaminophen  (TYLENOL ) 500 MG tablet Take 1,000 mg by mouth every 6 (six) hours as needed. pain     cetirizine (ZYRTEC) 10 MG tablet Take 10 mg by mouth daily as needed. allergies     Cholecalciferol  (VITAMIN D3) 50000 UNITS CAPS Take 1 capsule by mouth once a week. 8 capsule 0   diclofenac  Sodium (VOLTAREN ) 1 % GEL Apply 4 g topically 4 (four) times daily as needed (as needed).     Fluocinolone  Acetonide Body 0.01 % OIL Apply topically 3 times a day 118.28 mL 3   Fluocinolone  Acetonide Scalp 0.01 % OIL Apply to the affected area at bedtime twice a month as needed. 118.28 mL 4   fluticasone  (FLONASE ) 50 MCG/ACT nasal spray Place 2 sprays into both nostrils daily. 16 g 11   Ibuprofen 200 MG CAPS Take 1,000 mg by mouth 2 (two) times daily as needed.     ketoconazole  (NIZORAL ) 2 % shampoo Apply topically once a week as directed 120 mL 5   loratadine (CLARITIN) 10 MG tablet Take 10 mg by mouth daily.     ondansetron  (ZOFRAN ) 4 MG tablet Take 1 tablet (4 mg total) by mouth every 8 (eight) hours as needed for nausea or vomiting. 20 tablet 0   Probiotic Product (PROBIOTIC ADVANCED PO) Take 1  capsule by mouth every other day.     scopolamine  (TRANSDERM-SCOP) 1 MG/3DAYS Place one patch behind ear one time for motion sickness 1 patch 0   triamcinolone  cream (KENALOG ) 0.1 % Apply 1 Application topically daily as needed (as needed).     No current facility-administered medications on file prior to visit.    Past Surgical History:  Procedure Laterality Date   ABDOMINAL HYSTERECTOMY  2014   LEFT OOPHORECTOMY     WISDOM TOOTH EXTRACTION      No Known Allergies  BP 126/82   Ht 5\' 7"  (1.702 m)   Wt 210 lb (95.3 kg)   LMP 06/16/2011   BMI 32.89 kg/m       No data to display              No data to display              Objective:  Physical Exam:  Gen: NAD, comfortable in exam room  MSK:   Neck:   No gross deformity  Natural forward flexion of neck   TTP at left trap, no TTP at cervical spine, TTP at paraspinal cervical    ROM full although mild pain elicited with turning head left and right  Mild weakness on resisted  flexion of right arm   NV intact    Assessment & Plan:  Given persistent neck pain for 3 weeks, less likely to be just a simple neck strain from poor sleeping position. Patient does have a natural forward flexion of neck that is putting extra load on the cervical spine. Although she is young to have significant degenerative changes of cervical spine, some of the neck pain, radiation of pain in trapezius, and weakness in arm could be explained by early degenerative changes in the cervical spine.   - Start home exercises to improve posture and strengthen neck muscles - Get Xray of cervical spine  - Start Meloxicam  and muscle relaxants for pain relief  - Follow up in 6 week or sooner if pain is not better   Maybel Dambrosio B. Chhetri, MS4   I saw and examined the patient independently of MS4, patient presenting with some cervical paraspinal hypertonicity and some noted forward flexion of the cervical neck at baseline.  Will work on positional training  as well as do some isometric cervical strengthening and stretching.  In the meantime, we will go ahead and do meloxicam  and muscle relaxants for pain relief.  Will also get an x-ray of the cervical spine at this time.  Panav Jha MD

## 2023-06-09 ENCOUNTER — Encounter: Payer: Self-pay | Admitting: Family Medicine

## 2023-06-10 ENCOUNTER — Ambulatory Visit: Admitting: Physician Assistant

## 2023-06-10 ENCOUNTER — Encounter: Payer: Self-pay | Admitting: Family Medicine

## 2023-06-16 ENCOUNTER — Other Ambulatory Visit: Payer: Self-pay | Admitting: Internal Medicine

## 2023-06-16 ENCOUNTER — Other Ambulatory Visit (HOSPITAL_COMMUNITY): Payer: Self-pay

## 2023-06-16 ENCOUNTER — Other Ambulatory Visit: Payer: Self-pay | Admitting: Family Medicine

## 2023-06-17 ENCOUNTER — Other Ambulatory Visit (HOSPITAL_COMMUNITY): Payer: Self-pay

## 2023-06-17 MED ORDER — ONDANSETRON HCL 4 MG PO TABS
4.0000 mg | ORAL_TABLET | Freq: Three times a day (TID) | ORAL | 0 refills | Status: AC | PRN
  Filled 2023-06-17: qty 20, 7d supply, fill #0

## 2023-06-18 ENCOUNTER — Other Ambulatory Visit (HOSPITAL_COMMUNITY): Payer: Self-pay

## 2023-06-22 ENCOUNTER — Other Ambulatory Visit (HOSPITAL_COMMUNITY): Payer: Self-pay

## 2023-06-23 ENCOUNTER — Other Ambulatory Visit (HOSPITAL_COMMUNITY): Payer: Self-pay

## 2023-06-23 ENCOUNTER — Other Ambulatory Visit: Payer: Self-pay | Admitting: Family Medicine

## 2023-07-13 ENCOUNTER — Other Ambulatory Visit (HOSPITAL_COMMUNITY): Payer: Self-pay

## 2023-07-30 ENCOUNTER — Other Ambulatory Visit (HOSPITAL_COMMUNITY): Payer: Self-pay

## 2023-08-02 ENCOUNTER — Other Ambulatory Visit (HOSPITAL_COMMUNITY): Payer: Self-pay

## 2023-09-07 ENCOUNTER — Ambulatory Visit (INDEPENDENT_AMBULATORY_CARE_PROVIDER_SITE_OTHER): Admitting: Internal Medicine

## 2023-09-07 ENCOUNTER — Encounter: Payer: Self-pay | Admitting: Internal Medicine

## 2023-09-07 VITALS — BP 130/80 | HR 67 | Temp 97.4°F | Ht 68.0 in | Wt 215.2 lb

## 2023-09-07 DIAGNOSIS — E6609 Other obesity due to excess calories: Secondary | ICD-10-CM | POA: Diagnosis not present

## 2023-09-07 DIAGNOSIS — Z114 Encounter for screening for human immunodeficiency virus [HIV]: Secondary | ICD-10-CM | POA: Diagnosis not present

## 2023-09-07 DIAGNOSIS — Z1159 Encounter for screening for other viral diseases: Secondary | ICD-10-CM

## 2023-09-07 DIAGNOSIS — N943 Premenstrual tension syndrome: Secondary | ICD-10-CM

## 2023-09-07 DIAGNOSIS — Z23 Encounter for immunization: Secondary | ICD-10-CM

## 2023-09-07 DIAGNOSIS — E66811 Obesity, class 1: Secondary | ICD-10-CM | POA: Diagnosis not present

## 2023-09-07 DIAGNOSIS — Z780 Asymptomatic menopausal state: Secondary | ICD-10-CM | POA: Diagnosis not present

## 2023-09-07 DIAGNOSIS — Z0000 Encounter for general adult medical examination without abnormal findings: Secondary | ICD-10-CM

## 2023-09-07 DIAGNOSIS — Z6832 Body mass index (BMI) 32.0-32.9, adult: Secondary | ICD-10-CM | POA: Diagnosis not present

## 2023-09-07 DIAGNOSIS — R030 Elevated blood-pressure reading, without diagnosis of hypertension: Secondary | ICD-10-CM

## 2023-09-07 DIAGNOSIS — E559 Vitamin D deficiency, unspecified: Secondary | ICD-10-CM | POA: Diagnosis not present

## 2023-09-07 DIAGNOSIS — E8940 Asymptomatic postprocedural ovarian failure: Secondary | ICD-10-CM | POA: Diagnosis not present

## 2023-09-07 LAB — COMPREHENSIVE METABOLIC PANEL WITH GFR
ALT: 15 U/L (ref 0–35)
AST: 15 U/L (ref 0–37)
Albumin: 4.3 g/dL (ref 3.5–5.2)
Alkaline Phosphatase: 92 U/L (ref 39–117)
BUN: 17 mg/dL (ref 6–23)
CO2: 23 meq/L (ref 19–32)
Calcium: 9.4 mg/dL (ref 8.4–10.5)
Chloride: 105 meq/L (ref 96–112)
Creatinine, Ser: 0.7 mg/dL (ref 0.40–1.20)
GFR: 98.42 mL/min (ref >60.00)
Glucose, Bld: 100 mg/dL — ABNORMAL HIGH (ref 70–99)
Potassium: 3.6 meq/L (ref 3.5–5.1)
Sodium: 139 meq/L (ref 135–145)
Total Bilirubin: 0.4 mg/dL (ref 0.2–1.2)
Total Protein: 7.9 g/dL (ref 6.0–8.3)

## 2023-09-07 LAB — LIPID PANEL
Cholesterol: 164 mg/dL (ref 0–200)
HDL: 50 mg/dL (ref >39.00)
LDL Cholesterol: 97 mg/dL (ref 0–99)
NonHDL: 113.83
Total CHOL/HDL Ratio: 3
Triglycerides: 84 mg/dL (ref 0.0–149.0)
VLDL: 16.8 mg/dL (ref 0.0–40.0)

## 2023-09-07 LAB — VITAMIN D 25 HYDROXY (VIT D DEFICIENCY, FRACTURES): VITD: 35.73 ng/mL (ref 30.00–100.00)

## 2023-09-07 LAB — CBC WITH DIFFERENTIAL/PLATELET
Basophils Absolute: 0 K/uL (ref 0.0–0.1)
Basophils Relative: 0.6 % (ref 0.0–3.0)
Eosinophils Absolute: 0.2 K/uL (ref 0.0–0.7)
Eosinophils Relative: 2.7 % (ref 0.0–5.0)
HCT: 40.2 % (ref 36.0–46.0)
Hemoglobin: 13 g/dL (ref 12.0–15.0)
Lymphocytes Relative: 47.6 % — ABNORMAL HIGH (ref 12.0–46.0)
Lymphs Abs: 2.9 K/uL (ref 0.7–4.0)
MCHC: 32.4 g/dL (ref 30.0–36.0)
MCV: 84.8 fl (ref 78.0–100.0)
Monocytes Absolute: 0.4 K/uL (ref 0.1–1.0)
Monocytes Relative: 7.2 % (ref 3.0–12.0)
Neutro Abs: 2.6 K/uL (ref 1.4–7.7)
Neutrophils Relative %: 41.9 % — ABNORMAL LOW (ref 43.0–77.0)
Platelets: 236 K/uL (ref 150.0–400.0)
RBC: 4.74 Mil/uL (ref 3.87–5.11)
RDW: 13.9 % (ref 11.5–15.5)
WBC: 6.1 K/uL (ref 4.0–10.5)

## 2023-09-07 LAB — VITAMIN B12: Vitamin B-12: 353 pg/mL (ref 211–911)

## 2023-09-07 LAB — TSH: TSH: 2.64 u[IU]/mL (ref 0.35–5.50)

## 2023-09-07 NOTE — Addendum Note (Signed)
 Addended by: KATHRYNE MILLMAN B on: 09/07/2023 08:22 AM   Modules accepted: Orders

## 2023-09-07 NOTE — Progress Notes (Signed)
 Established Patient Office Visit     CC/Reason for Visit: Annual preventive exam  HPI: Melissa Evans is a 53 y.o. female who is coming in today for the above mentioned reasons. Past Medical History is significant for: Vitamin D  deficiency obesity.  She underwent early surgical menopause due to a large benign ovarian mass status post hysterectomy and bilateral salpingo-oophorectomy approximately 12 years ago.  She is starting to have vasomotor symptoms again.  She was on estradiol  patch for some time but had to discontinue due to skin irritation.  She is due for pneumonia vaccine.  Has routine eye and dental care.  Is interested in getting a calcium CT for restratification.   Past Medical/Surgical History: Past Medical History:  Diagnosis Date   Arthritis    generalized   LPRD (laryngopharyngeal reflux disease)    PMS (premenstrual syndrome)    Seasonal allergies     Past Surgical History:  Procedure Laterality Date   ABDOMINAL HYSTERECTOMY  2014   LEFT OOPHORECTOMY     WISDOM TOOTH EXTRACTION      Social History:  reports that she has never smoked. She has never used smokeless tobacco. She reports that she does not currently use alcohol. She reports that she does not use drugs.  Allergies: No Known Allergies  Family History:  Family History  Problem Relation Age of Onset   Lung cancer Mother    Lung cancer Maternal Grandfather    Fibroids Sister    Parkinsonism Maternal Grandmother    Diabetes Maternal Grandmother    Colon polyps Brother    Colon cancer Neg Hx    Esophageal cancer Neg Hx    Rectal cancer Neg Hx    Stomach cancer Neg Hx      Current Outpatient Medications:    acetaminophen  (TYLENOL ) 500 MG tablet, Take 1,000 mg by mouth every 6 (six) hours as needed. pain, Disp: , Rfl:    cetirizine (ZYRTEC) 10 MG tablet, Take 10 mg by mouth daily as needed. allergies, Disp: , Rfl:    Cholecalciferol  (VITAMIN D3) 50000 UNITS CAPS, Take 1 capsule by mouth  once a week., Disp: 8 capsule, Rfl: 0   cyclobenzaprine  (FLEXERIL ) 10 MG tablet, Take 1 tablet (10 mg total) by mouth at bedtime as needed for muscle spasms., Disp: 20 tablet, Rfl: 0   diclofenac  Sodium (VOLTAREN ) 1 % GEL, Apply 4 g topically 4 (four) times daily as needed (as needed)., Disp: , Rfl:    Fluocinolone  Acetonide Body 0.01 % OIL, Apply topically 3 times a day, Disp: 118.28 mL, Rfl: 3   Fluocinolone  Acetonide Scalp 0.01 % OIL, Apply to the affected area at bedtime twice a month as needed., Disp: 118.28 mL, Rfl: 4   fluticasone  (FLONASE ) 50 MCG/ACT nasal spray, Place 2 sprays into both nostrils daily., Disp: 16 g, Rfl: 11   Ibuprofen 200 MG CAPS, Take 1,000 mg by mouth 2 (two) times daily as needed., Disp: , Rfl:    ketoconazole  (NIZORAL ) 2 % shampoo, Apply topically once a week as directed, Disp: 120 mL, Rfl: 5   loratadine (CLARITIN) 10 MG tablet, Take 10 mg by mouth daily., Disp: , Rfl:    meloxicam  (MOBIC ) 15 MG tablet, Take 1 tablet (15 mg total) by mouth daily., Disp: 10 tablet, Rfl: 0   ondansetron  (ZOFRAN ) 4 MG tablet, Take 1 tablet (4 mg total) by mouth every 8 (eight) hours as needed for nausea or vomiting., Disp: 20 tablet, Rfl: 0   Probiotic  Product (PROBIOTIC ADVANCED PO), Take 1 capsule by mouth every other day., Disp: , Rfl:    triamcinolone  cream (KENALOG ) 0.1 %, Apply 1 Application topically daily as needed (as needed)., Disp: , Rfl:    scopolamine  (TRANSDERM-SCOP) 1 MG/3DAYS, Place one patch behind ear one time for motion sickness, Disp: 1 patch, Rfl: 0  Review of Systems:  Negative unless indicated in HPI.   Physical Exam: Vitals:   09/07/23 0710  BP: 130/80  Pulse: 67  Temp: (!) 97.4 F (36.3 C)  TempSrc: Oral  SpO2: 99%  Weight: 215 lb 3.2 oz (97.6 kg)  Height: 5' 8 (1.727 m)    Body mass index is 32.72 kg/m.   Physical Exam Vitals reviewed.  Constitutional:      General: She is not in acute distress.    Appearance: Normal appearance. She is  obese. She is not ill-appearing, toxic-appearing or diaphoretic.  HENT:     Head: Normocephalic.     Right Ear: Tympanic membrane, ear canal and external ear normal. There is no impacted cerumen.     Left Ear: Tympanic membrane, ear canal and external ear normal. There is no impacted cerumen.     Nose: Nose normal.     Mouth/Throat:     Mouth: Mucous membranes are moist.     Pharynx: Oropharynx is clear. No oropharyngeal exudate or posterior oropharyngeal erythema.  Eyes:     General: No scleral icterus.       Right eye: No discharge.        Left eye: No discharge.     Conjunctiva/sclera: Conjunctivae normal.     Pupils: Pupils are equal, round, and reactive to light.  Neck:     Vascular: No carotid bruit.  Cardiovascular:     Rate and Rhythm: Normal rate and regular rhythm.     Pulses: Normal pulses.     Heart sounds: Normal heart sounds.  Pulmonary:     Effort: Pulmonary effort is normal. No respiratory distress.     Breath sounds: Normal breath sounds.  Abdominal:     General: Abdomen is flat. Bowel sounds are normal.     Palpations: Abdomen is soft.  Musculoskeletal:        General: Normal range of motion.     Cervical back: Normal range of motion.  Skin:    General: Skin is warm and dry.  Neurological:     General: No focal deficit present.     Mental Status: She is alert and oriented to person, place, and time. Mental status is at baseline.  Psychiatric:        Mood and Affect: Mood normal.        Behavior: Behavior normal.        Thought Content: Thought content normal.        Judgment: Judgment normal.      Impression and Plan:  Vitamin D  deficiency -     VITAMIN D  25 Hydroxy (Vit-D Deficiency, Fractures); Future  Encounter for preventive health examination  Elevated BP without diagnosis of hypertension  Immunization due  PMS (premenstrual syndrome)  Surgical menopause -     DG Bone Density; Future  Encounter for hepatitis C screening test for low  risk patient -     Hepatitis C antibody; Future  Encounter for screening for HIV -     HIV Antibody (routine testing w rflx); Future  Class 1 obesity due to excess calories without serious comorbidity with body mass index (BMI) of 32.0  to 32.9 in adult -     CBC with Differential/Platelet; Future -     Comprehensive metabolic panel with GFR; Future -     Lipid panel; Future -     TSH; Future -     Vitamin B12; Future -     CT CARDIAC SCORING (SELF PAY ONLY); Future  Postmenopausal estrogen deficiency -     DG Bone Density; Future   -Recommend routine eye and dental care. -Healthy lifestyle discussed in detail. -Labs to be updated today. -Prostate cancer screening: Not applicable Health Maintenance  Topic Date Due   HIV Screening  Never done   Hepatitis C Screening  Never done   Hepatitis B Vaccine (1 of 3 - 19+ 3-dose series) Never done   COVID-19 Vaccine (4 - 2024-25 season) 10/11/2022   Flu Shot  09/10/2023   Mammogram  12/08/2024   DTaP/Tdap/Td vaccine (2 - Td or Tdap) 07/11/2027   Colon Cancer Screening  05/14/2029   Zoster (Shingles) Vaccine  Completed   HPV Vaccine  Aged Out   Meningitis B Vaccine  Aged Out     -PCV 20 in office today. - Will order DEXA scan to rule out osteoporosis given her early surgical menopause. - She will consider starting estrogen and will reach out back to me with response. - Calcium CT ordered per request for cardiac risk stratification.    Tully Theophilus Andrews, MD [facility]

## 2023-09-08 LAB — HIV ANTIBODY (ROUTINE TESTING W REFLEX): HIV 1&2 Ab, 4th Generation: NONREACTIVE

## 2023-09-08 LAB — HEPATITIS C ANTIBODY: Hepatitis C Ab: NONREACTIVE

## 2023-09-09 ENCOUNTER — Ambulatory Visit: Payer: Self-pay | Admitting: Internal Medicine

## 2023-10-17 IMAGING — DX DG FOOT COMPLETE 3+V*L*
3 series · 3 of 3 positions shown · non-contrast
Comparison: None.

CLINICAL DATA: Left foot pain

EXAM:
LEFT FOOT - COMPLETE 3+ VIEW

[foot ap wb]
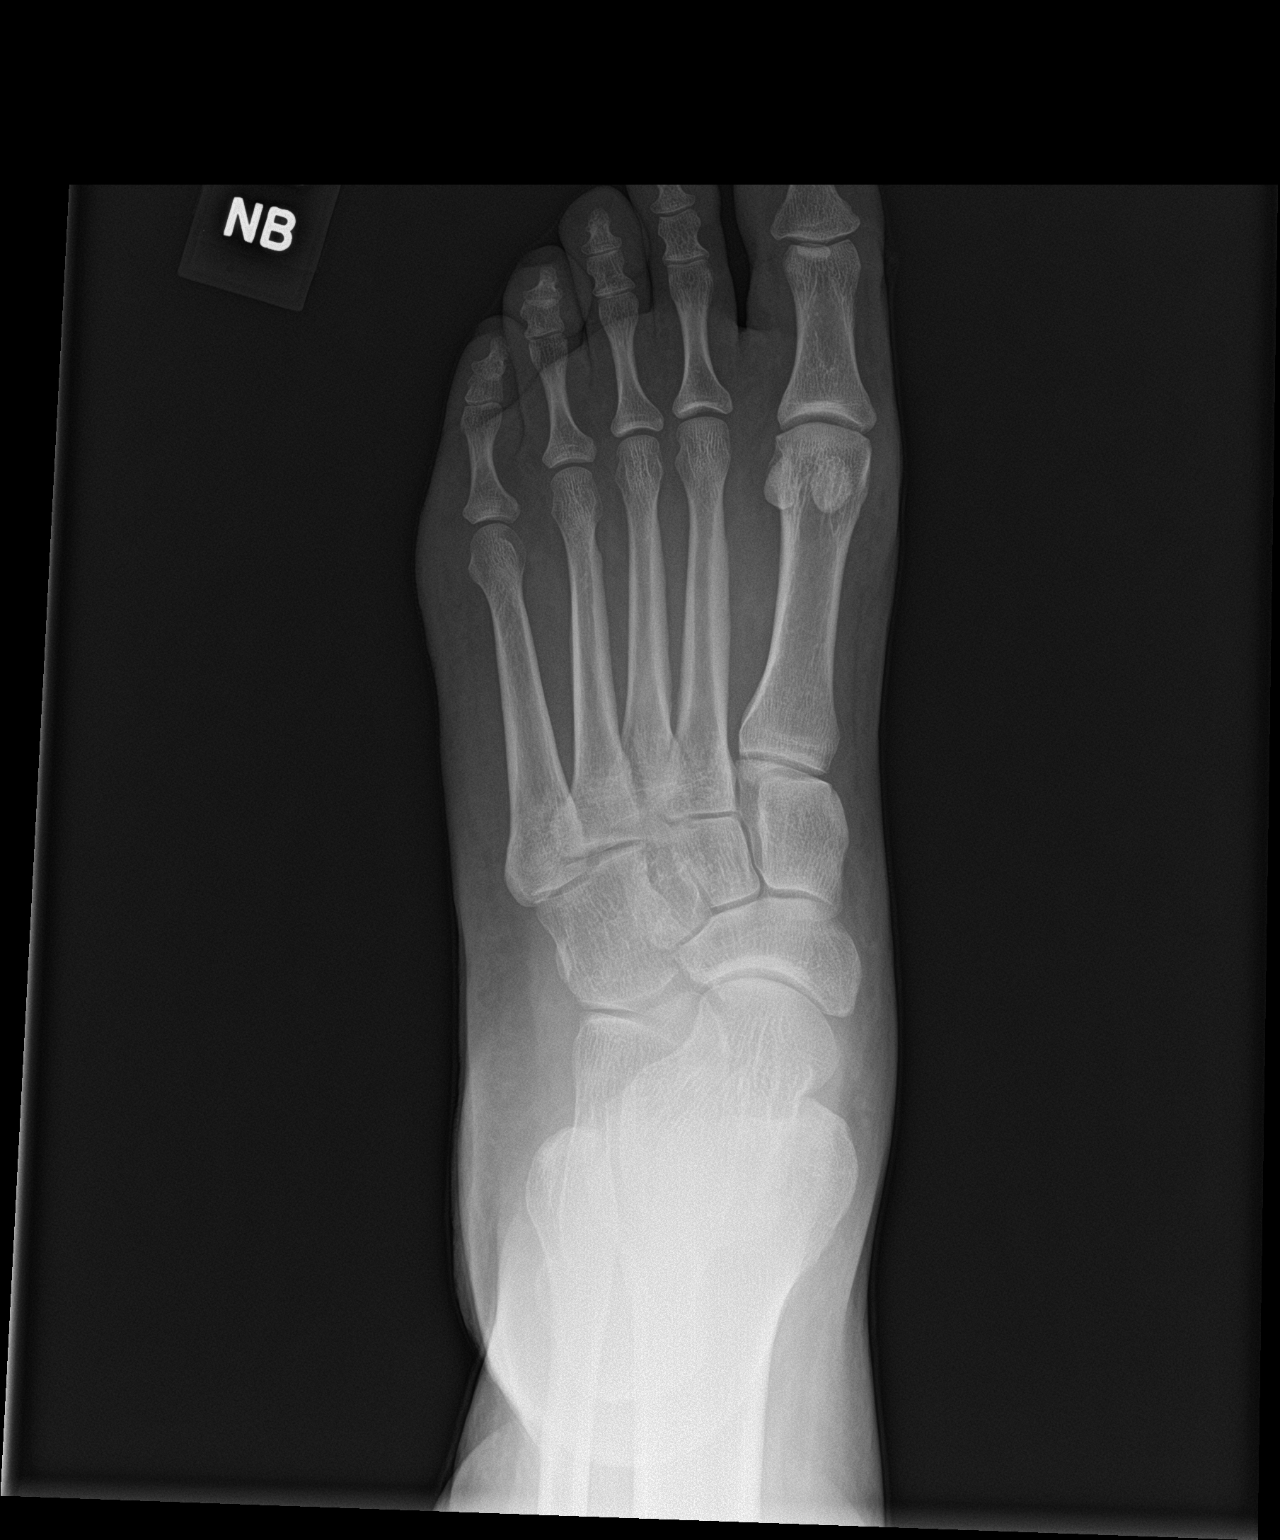

[foot obl wb]
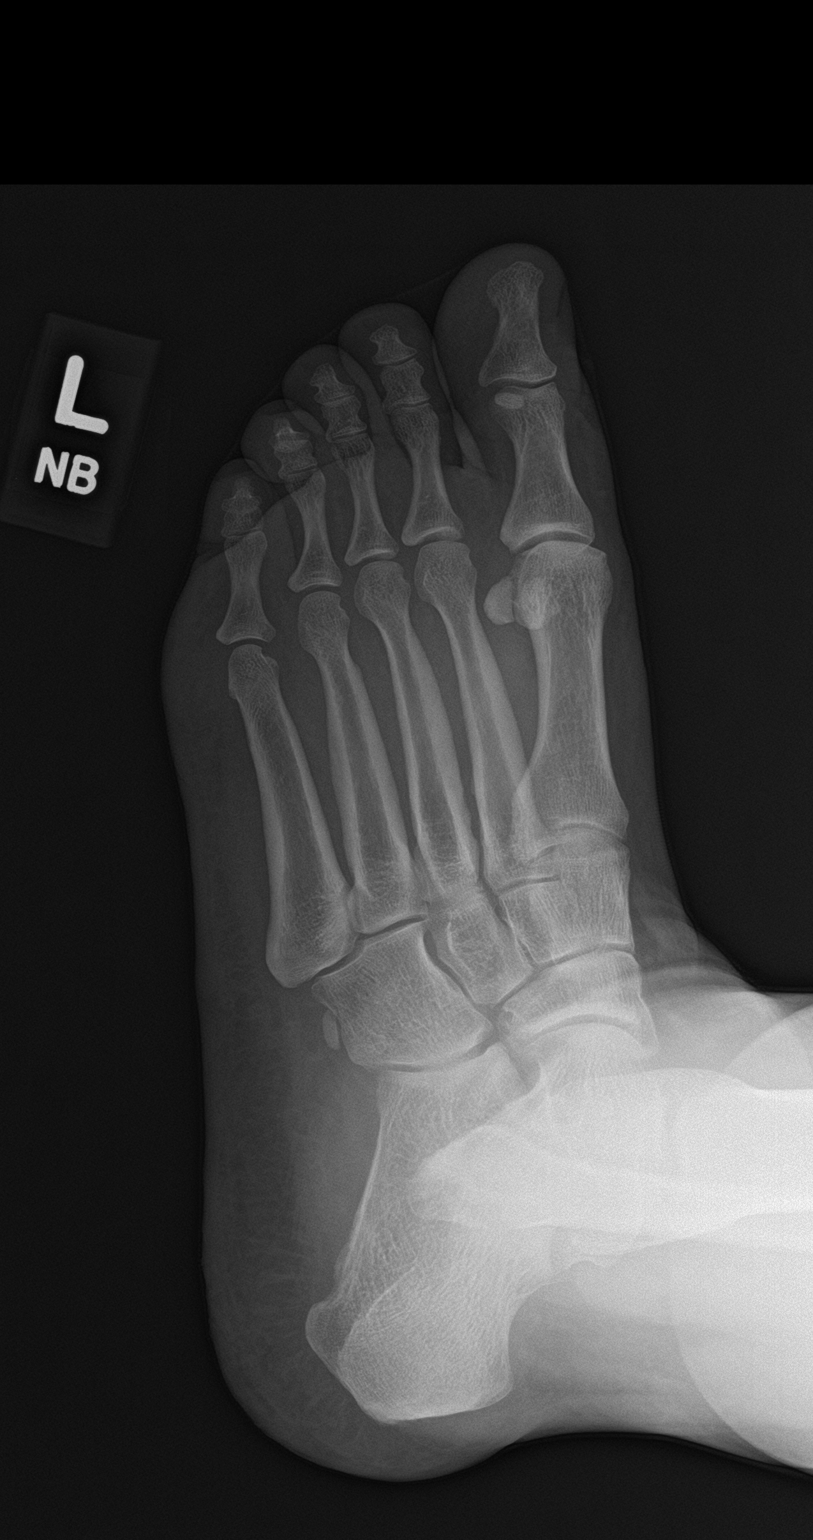

[foot lat wb]
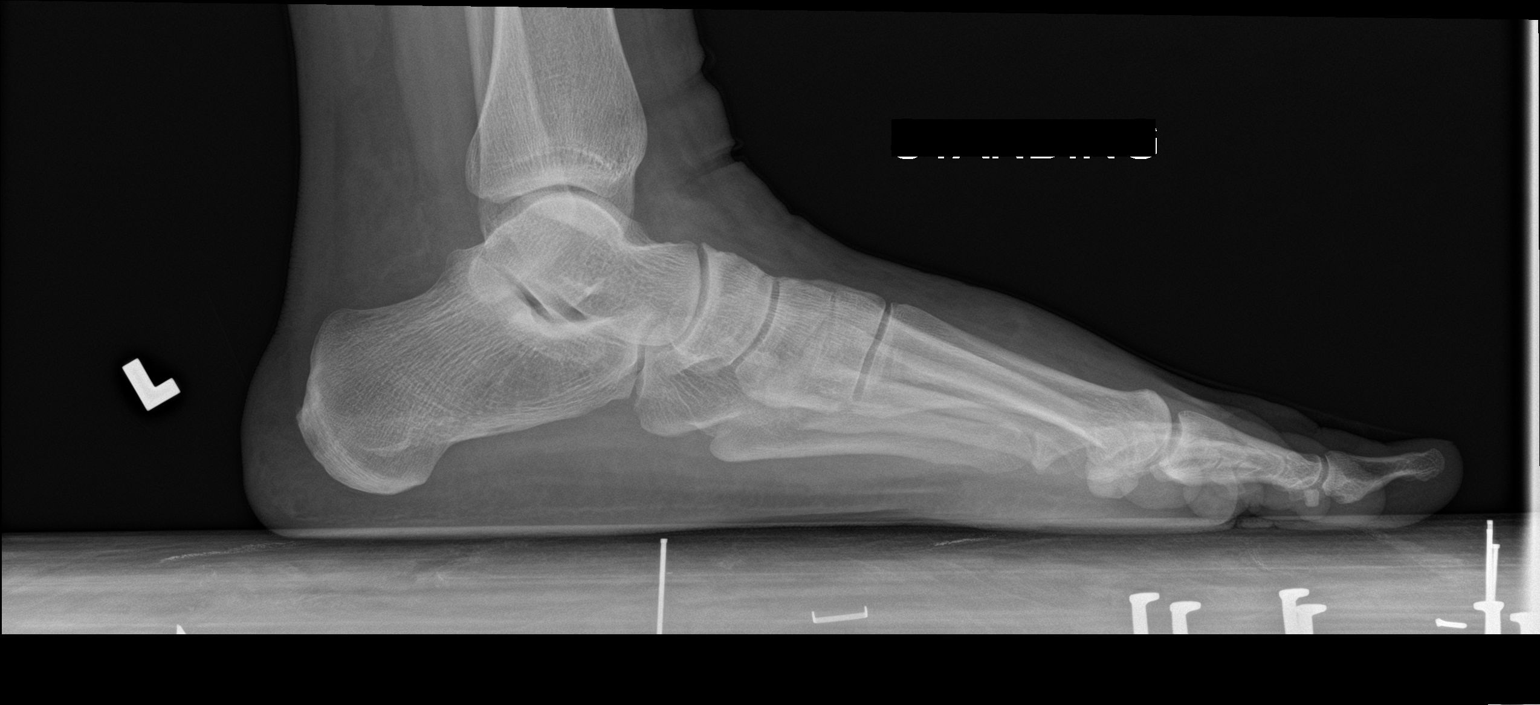

[3 of 3 positions shown; findings below may reference images not displayed]

FINDINGS: There is no evidence of fracture or dislocation. There is no
evidence of arthropathy or other focal bone abnormality. Soft
tissues are unremarkable.
IMPRESSION: Negative.

## 2023-11-11 ENCOUNTER — Other Ambulatory Visit: Payer: Self-pay | Admitting: Internal Medicine

## 2023-11-11 DIAGNOSIS — Z1231 Encounter for screening mammogram for malignant neoplasm of breast: Secondary | ICD-10-CM

## 2023-12-01 ENCOUNTER — Other Ambulatory Visit (HOSPITAL_BASED_OUTPATIENT_CLINIC_OR_DEPARTMENT_OTHER): Payer: Self-pay

## 2023-12-01 MED ORDER — FLUZONE 0.5 ML IM SUSY
0.5000 mL | PREFILLED_SYRINGE | Freq: Once | INTRAMUSCULAR | 0 refills | Status: AC
  Filled 2023-12-01: qty 0.5, 1d supply, fill #0

## 2023-12-10 ENCOUNTER — Ambulatory Visit
Admission: RE | Admit: 2023-12-10 | Discharge: 2023-12-10 | Disposition: A | Discharge status: Home / Self Care | Source: Ambulatory Visit | Attending: Internal Medicine | Admitting: Internal Medicine

## 2023-12-10 DIAGNOSIS — Z1231 Encounter for screening mammogram for malignant neoplasm of breast: Secondary | ICD-10-CM

## 2023-12-13 ENCOUNTER — Encounter: Payer: Self-pay | Admitting: Radiology

## 2024-01-17 ENCOUNTER — Telehealth: Payer: Self-pay | Admitting: *Deleted

## 2024-01-17 NOTE — Telephone Encounter (Signed)
 Copied from CRM [id number]. Topic: General - Other >> Jan 14, 2024  4:32 PM Alfonso HERO wrote: Reason for CRM: patient calling to see how many units of vitamin D  does she need to be taking. Asking for a message via mychart
